# Patient Record
Sex: Female | Born: 1955 | Race: White | Hispanic: No | Marital: Married | Smoking: Never smoker
Health system: Southern US, Community
[De-identification: ages and names within clinical notes are randomized; demographics above are authoritative.]

## PROBLEM LIST (undated history)

## (undated) DIAGNOSIS — F419 Anxiety disorder, unspecified: Secondary | ICD-10-CM

## (undated) HISTORY — DX: Anxiety disorder, unspecified: F41.9

---

## 1993-12-10 HISTORY — PX: TUBAL LIGATION: SHX77

## 2004-11-01 ENCOUNTER — Ambulatory Visit: Payer: Self-pay

## 2005-02-07 HISTORY — PX: FOOT SURGERY: SHX648

## 2005-03-02 ENCOUNTER — Ambulatory Visit: Payer: Self-pay | Admitting: Podiatry

## 2005-05-24 ENCOUNTER — Ambulatory Visit: Payer: Self-pay

## 2006-12-10 HISTORY — PX: ABDOMINAL HYSTERECTOMY: SHX81

## 2007-10-23 ENCOUNTER — Ambulatory Visit: Payer: Self-pay

## 2007-11-26 ENCOUNTER — Ambulatory Visit: Payer: Self-pay | Admitting: Gastroenterology

## 2007-11-26 LAB — HM COLONOSCOPY: HM Colonoscopy: NORMAL

## 2008-01-29 ENCOUNTER — Ambulatory Visit: Payer: Self-pay | Admitting: Obstetrics and Gynecology

## 2008-02-06 ENCOUNTER — Inpatient Hospital Stay: Payer: Self-pay | Admitting: Obstetrics and Gynecology

## 2008-11-16 ENCOUNTER — Ambulatory Visit: Payer: Self-pay | Admitting: Obstetrics and Gynecology

## 2009-09-05 ENCOUNTER — Ambulatory Visit: Payer: Self-pay | Admitting: Otolaryngology

## 2009-11-30 ENCOUNTER — Ambulatory Visit: Payer: Self-pay | Admitting: Obstetrics and Gynecology

## 2009-12-05 ENCOUNTER — Ambulatory Visit: Payer: Self-pay | Admitting: Gastroenterology

## 2010-12-01 ENCOUNTER — Ambulatory Visit: Payer: Self-pay

## 2012-01-18 ENCOUNTER — Ambulatory Visit: Payer: Self-pay | Admitting: Urology

## 2012-05-01 LAB — HM PAP SMEAR: HM PAP: NEGATIVE

## 2012-07-30 ENCOUNTER — Ambulatory Visit: Payer: Self-pay | Admitting: Family Medicine

## 2013-11-24 ENCOUNTER — Ambulatory Visit: Payer: Self-pay | Admitting: Family Medicine

## 2014-05-13 LAB — BASIC METABOLIC PANEL
BUN: 19 mg/dL (ref 4–21)
Creatinine: 0.9 mg/dL (ref 0.5–1.1)
Glucose: 81 mg/dL
Potassium: 4.4 mmol/L (ref 3.4–5.3)
SODIUM: 141 mmol/L (ref 137–147)

## 2014-05-13 LAB — LIPID PANEL
CHOLESTEROL: 204 mg/dL — AB (ref 0–200)
HDL: 102 mg/dL — AB (ref 35–70)
LDL CALC: 94 mg/dL
Triglycerides: 41 mg/dL (ref 40–160)

## 2014-05-13 LAB — CBC AND DIFFERENTIAL
HCT: 40 % (ref 36–46)
Hemoglobin: 13.2 g/dL (ref 12.0–16.0)
Platelets: 214 10*3/uL (ref 150–399)
WBC: 4.5 10^3/mL

## 2014-05-13 LAB — HEPATIC FUNCTION PANEL
ALT: 20 U/L (ref 7–35)
AST: 22 U/L (ref 13–35)

## 2014-05-13 LAB — TSH: TSH: 2.64 u[IU]/mL (ref 0.41–5.90)

## 2014-12-14 ENCOUNTER — Ambulatory Visit: Payer: Self-pay | Admitting: Family Medicine

## 2014-12-14 LAB — HM MAMMOGRAPHY

## 2015-04-11 DIAGNOSIS — F419 Anxiety disorder, unspecified: Secondary | ICD-10-CM | POA: Insufficient documentation

## 2015-04-11 DIAGNOSIS — E7800 Pure hypercholesterolemia, unspecified: Secondary | ICD-10-CM | POA: Insufficient documentation

## 2015-04-11 DIAGNOSIS — Q676 Pectus excavatum: Secondary | ICD-10-CM | POA: Insufficient documentation

## 2015-04-11 DIAGNOSIS — E559 Vitamin D deficiency, unspecified: Secondary | ICD-10-CM | POA: Insufficient documentation

## 2015-04-11 DIAGNOSIS — F32A Depression, unspecified: Secondary | ICD-10-CM | POA: Insufficient documentation

## 2015-04-11 DIAGNOSIS — F329 Major depressive disorder, single episode, unspecified: Secondary | ICD-10-CM | POA: Insufficient documentation

## 2015-04-11 DIAGNOSIS — G2581 Restless legs syndrome: Secondary | ICD-10-CM | POA: Insufficient documentation

## 2015-04-11 DIAGNOSIS — L309 Dermatitis, unspecified: Secondary | ICD-10-CM | POA: Insufficient documentation

## 2015-06-03 ENCOUNTER — Ambulatory Visit (INDEPENDENT_AMBULATORY_CARE_PROVIDER_SITE_OTHER): Payer: BLUE CROSS/BLUE SHIELD | Admitting: Physician Assistant

## 2015-06-03 ENCOUNTER — Encounter: Payer: Self-pay | Admitting: Physician Assistant

## 2015-06-03 VITALS — BP 110/68 | HR 70 | Temp 97.2°F | Resp 16 | Ht 63.0 in | Wt 117.0 lb

## 2015-06-03 DIAGNOSIS — E7800 Pure hypercholesterolemia, unspecified: Secondary | ICD-10-CM

## 2015-06-03 DIAGNOSIS — Z0000 Encounter for general adult medical examination without abnormal findings: Secondary | ICD-10-CM | POA: Diagnosis not present

## 2015-06-03 DIAGNOSIS — N368 Other specified disorders of urethra: Secondary | ICD-10-CM | POA: Diagnosis not present

## 2015-06-03 DIAGNOSIS — N393 Stress incontinence (female) (male): Secondary | ICD-10-CM | POA: Diagnosis not present

## 2015-06-03 LAB — POCT URINALYSIS DIPSTICK
BILIRUBIN UA: NEGATIVE
Glucose, UA: NEGATIVE
Ketones, UA: NEGATIVE
Leukocytes, UA: NEGATIVE
NITRITE UA: NEGATIVE
PH UA: 6
Protein, UA: NEGATIVE
RBC UA: NEGATIVE
Spec Grav, UA: 1.015
Urobilinogen, UA: 0.2

## 2015-06-03 NOTE — Patient Instructions (Signed)
Health Maintenance Adopting a healthy lifestyle and getting preventive care can go a long way to promote health and wellness. Talk with your health care provider about what schedule of regular examinations is right for you. This is a good chance for you to check in with your provider about disease prevention and staying healthy. In between checkups, there are plenty of things you can do on your own. Experts have done a lot of research about which lifestyle changes and preventive measures are most likely to keep you healthy. Ask your health care provider for more information. WEIGHT AND DIET  Eat a healthy diet 1. Be sure to include plenty of vegetables, fruits, low-fat dairy products, and lean protein. 2. Do not eat a lot of foods high in solid fats, added sugars, or salt. 3. Get regular exercise. This is one of the most important things you can do for your health. 1. Most adults should exercise for at least 150 minutes each week. The exercise should increase your heart rate and make you sweat (moderate-intensity exercise). 2. Most adults should also do strengthening exercises at least twice a week. This is in addition to the moderate-intensity exercise.  Maintain a healthy weight 1. Body mass index (BMI) is a measurement that can be used to identify possible weight problems. It estimates body fat based on height and weight. Your health care provider can help determine your BMI and help you achieve or maintain a healthy weight. 2. For females 6 years of age and older:  1. A BMI below 18.5 is considered underweight. 2. A BMI of 18.5 to 24.9 is normal. 3. A BMI of 25 to 29.9 is considered overweight. 4. A BMI of 30 and above is considered obese.  Watch levels of cholesterol and blood lipids 1. You should start having your blood tested for lipids and cholesterol at 59 years of age, then have this test every 5 years. 2. You may need to have your cholesterol levels checked more often if: 1. Your  lipid or cholesterol levels are high. 2. You are older than 59 years of age. 3. You are at high risk for heart disease.  CANCER SCREENING   Lung Cancer 1. Lung cancer screening is recommended for adults 7-87 years old who are at high risk for lung cancer because of a history of smoking. 2. A yearly low-dose CT scan of the lungs is recommended for people who: 1. Currently smoke. 2. Have quit within the past 15 years. 3. Have at least a 30-pack-year history of smoking. A pack year is smoking an average of one pack of cigarettes a day for 1 year. 3. Yearly screening should continue until it has been 15 years since you quit. 4. Yearly screening should stop if you develop a health problem that would prevent you from having lung cancer treatment.  Breast Cancer  Practice breast self-awareness. This means understanding how your breasts normally appear and feel.  It also means doing regular breast self-exams. Let your health care provider know about any changes, no matter how small.  If you are in your 20s or 30s, you should have a clinical breast exam (CBE) by a health care provider every 1-3 years as part of a regular health exam.  If you are 30 or older, have a CBE every year. Also consider having a breast X-Madlock (mammogram) every year.  If you have a family history of breast cancer, talk to your health care provider about genetic screening.  If you are  at high risk for breast cancer, talk to your health care provider about having an MRI and a mammogram every year.  Breast cancer gene (BRCA) assessment is recommended for women who have family members with BRCA-related cancers. BRCA-related cancers include:  Breast.  Ovarian.  Tubal.  Peritoneal cancers.  Results of the assessment will determine the need for genetic counseling and BRCA1 and BRCA2 testing. Cervical Cancer Routine pelvic examinations to screen for cervical cancer are no longer recommended for nonpregnant women who  are considered low risk for cancer of the pelvic organs (ovaries, uterus, and vagina) and who do not have symptoms. A pelvic examination may be necessary if you have symptoms including those associated with pelvic infections. Ask your health care provider if a screening pelvic exam is right for you.   The Pap test is the screening test for cervical cancer for women who are considered at risk.  If you had a hysterectomy for a problem that was not cancer or a condition that could lead to cancer, then you no longer need Pap tests.  If you are older than 65 years, and you have had normal Pap tests for the past 10 years, you no longer need to have Pap tests.  If you have had past treatment for cervical cancer or a condition that could lead to cancer, you need Pap tests and screening for cancer for at least 20 years after your treatment.  If you no longer get a Pap test, assess your risk factors if they change (such as having a new sexual partner). This can affect whether you should start being screened again.  Some women have medical problems that increase their chance of getting cervical cancer. If this is the case for you, your health care provider may recommend more frequent screening and Pap tests.  The human papillomavirus (HPV) test is another test that may be used for cervical cancer screening. The HPV test looks for the virus that can cause cell changes in the cervix. The cells collected during the Pap test can be tested for HPV.  The HPV test can be used to screen women 2 years of age and older. Getting tested for HPV can extend the interval between normal Pap tests from three to five years.  An HPV test also should be used to screen women of any age who have unclear Pap test results.  After 59 years of age, women should have HPV testing as often as Pap tests.  Colorectal Cancer  This type of cancer can be detected and often prevented.  Routine colorectal cancer screening usually  begins at 59 years of age and continues through 59 years of age.  Your health care provider may recommend screening at an earlier age if you have risk factors for colon cancer.  Your health care provider may also recommend using home test kits to check for hidden blood in the stool.  A small camera at the end of a tube can be used to examine your colon directly (sigmoidoscopy or colonoscopy). This is done to check for the earliest forms of colorectal cancer.  Routine screening usually begins at age 57.  Direct examination of the colon should be repeated every 5-10 years through 59 years of age. However, you may need to be screened more often if early forms of precancerous polyps or small growths are found. Skin Cancer  Check your skin from head to toe regularly.  Tell your health care provider about any new moles or changes in  moles, especially if there is a change in a mole's shape or color.  Also tell your health care provider if you have a mole that is larger than the size of a pencil eraser.  Always use sunscreen. Apply sunscreen liberally and repeatedly throughout the day.  Protect yourself by wearing long sleeves, pants, a wide-brimmed hat, and sunglasses whenever you are outside. HEART DISEASE, DIABETES, AND HIGH BLOOD PRESSURE   Have your blood pressure checked at least every 1-2 years. High blood pressure causes heart disease and increases the risk of stroke.  If you are between 32 years and 30 years old, ask your health care provider if you should take aspirin to prevent strokes.  Have regular diabetes screenings. This involves taking a blood sample to check your fasting blood sugar level.  If you are at a normal weight and have a low risk for diabetes, have this test once every three years after 59 years of age.  If you are overweight and have a high risk for diabetes, consider being tested at a younger age or more often. PREVENTING INFECTION  Hepatitis B  If you have a  higher risk for hepatitis B, you should be screened for this virus. You are considered at high risk for hepatitis B if:  You were born in a country where hepatitis B is common. Ask your health care provider which countries are considered high risk.  Your parents were born in a high-risk country, and you have not been immunized against hepatitis B (hepatitis B vaccine).  You have HIV or AIDS.  You use needles to inject street drugs.  You live with someone who has hepatitis B.  You have had sex with someone who has hepatitis B.  You get hemodialysis treatment.  You take certain medicines for conditions, including cancer, organ transplantation, and autoimmune conditions. Hepatitis C  Blood testing is recommended for:  Everyone born from [birth year] through 1965.  Anyone with known risk factors for hepatitis C. Sexually transmitted infections (STIs)  You should be screened for sexually transmitted infections (STIs) including gonorrhea and chlamydia if:  You are sexually active and are younger than 59 years of age.  You are older than 59 years of age and your health care provider tells you that you are at risk for this type of infection.  Your sexual activity has changed since you were last screened and you are at an increased risk for chlamydia or gonorrhea. Ask your health care provider if you are at risk.  If you do not have HIV, but are at risk, it may be recommended that you take a prescription medicine daily to prevent HIV infection. This is called pre-exposure prophylaxis (PrEP). You are considered at risk if:  You are sexually active and do not regularly use condoms or know the HIV status of your partner(s).  You take drugs by injection.  You are sexually active with a partner who has HIV. Talk with your health care provider about whether you are at high risk of being infected with HIV. If you choose to begin PrEP, you should first be tested for HIV. You should then be tested  every 3 months for as long as you are taking PrEP.  PREGNANCY   If you are premenopausal and you may become pregnant, ask your health care provider about preconception counseling.  If you may become pregnant, take 400 to 800 micrograms (mcg) of folic acid every day.  If you want to prevent pregnancy, talk to your  health care provider about birth control (contraception). OSTEOPOROSIS AND MENOPAUSE   Osteoporosis is a disease in which the bones lose minerals and strength with aging. This can result in serious bone fractures. Your risk for osteoporosis can be identified using a bone density scan.  If you are 34 years of age or older, or if you are at risk for osteoporosis and fractures, ask your health care provider if you should be screened.  Ask your health care provider whether you should take a calcium or vitamin D supplement to lower your risk for osteoporosis.  Menopause may have certain physical symptoms and risks.  Hormone replacement therapy may reduce some of these symptoms and risks. Talk to your health care provider about whether hormone replacement therapy is right for you.  HOME CARE INSTRUCTIONS   Schedule regular health, dental, and eye exams.  Stay current with your immunizations.   Do not use any tobacco products including cigarettes, chewing tobacco, or electronic cigarettes.  If you are pregnant, do not drink alcohol.  If you are breastfeeding, limit how much and how often you drink alcohol.  Limit alcohol intake to no more than 1 drink per day for nonpregnant women. One drink equals 12 ounces of beer, 5 ounces of wine, or 1 ounces of hard liquor.  Do not use street drugs.  Do not share needles.  Ask your health care provider for help if you need support or information about quitting drugs.  Tell your health care provider if you often feel depressed.  Tell your health care provider if you have ever been abused or do not feel safe at home. Document  Released: 06/11/2011 Document Revised: 04/12/2014 Document Reviewed: 10/28/2013 [facility] Patient Information 2015 [place], [place]. This information is not intended to replace advice given to you by your health care provider. Make sure you discuss any questions you have with your health care provider.     Why follow it? Research shows. . Those who follow the Mediterranean diet have a reduced risk of heart disease  . The diet is associated with a reduced incidence of Parkinson's and Alzheimer's diseases . People following the diet may have longer life expectancies and lower rates of chronic diseases  . The Dietary Guidelines for Americans recommends the Mediterranean diet as an eating plan to promote health and prevent disease  What Is the Mediterranean Diet?  . Healthy eating plan based on typical foods and recipes of Mediterranean-style cooking . The diet is primarily a plant based diet; these foods should make up a majority of meals   Starches - Plant based foods should make up a majority of meals - They are an important sources of vitamins, minerals, energy, antioxidants, and fiber - Choose whole grains, foods high in fiber and minimally processed items  - Typical grain sources include wheat, oats, barley, corn, brown rice, bulgar, farro, millet, polenta, couscous  - Various types of beans include chickpeas, lentils, fava beans, black beans, white beans   Fruits  Veggies - Large quantities of antioxidant rich fruits & veggies; 6 or more servings  - Vegetables can be eaten raw or lightly drizzled with oil and cooked  - Vegetables common to the traditional Mediterranean Diet include: artichokes, arugula, beets, broccoli, brussel sprouts, cabbage, carrots, celery, collard greens, cucumbers, eggplant, kale, leeks, lemons, lettuce, mushrooms, okra, onions, peas, peppers, potatoes, pumpkin, radishes, rutabaga, shallots, spinach, sweet potatoes, turnips, zucchini - Fruits common to the Mediterranean  Diet include: apples, apricots, avocados, cherries, clementines, dates, figs, grapefruits,  grapes, melons, nectarines, oranges, peaches, pears, pomegranates, strawberries, tangerines  Fats - Replace butter and margarine with healthy oils, such as olive oil, canola oil, and tahini  - Limit nuts to no more than a handful a day  - Nuts include walnuts, almonds, pecans, pistachios, pine nuts  - Limit or avoid candied, honey roasted or heavily salted nuts - Olives are central to the Mediterranean diet - can be eaten whole or used in a variety of dishes   Meats Protein - Limiting red meat: no more than a few times a month - When eating red meat: choose lean cuts and keep the portion to the size of deck of cards - Eggs: approx. 0 to 4 times a week  - Fish and lean poultry: at least 2 a week  - Healthy protein sources include, chicken, [place], lean beef, lamb - Increase intake of seafood such as tuna, salmon, trout, mackerel, shrimp, scallops - Avoid or limit high fat processed meats such as sausage and bacon  Dairy - Include moderate amounts of low fat dairy products  - Focus on healthy dairy such as fat free yogurt, skim milk, low or reduced fat cheese - Limit dairy products higher in fat such as whole or 2% milk, cheese, ice cream  Alcohol - Moderate amounts of red wine is ok  - No more than 5 oz daily for women (all ages) and men older than age 74  - No more than 10 oz of wine daily for men younger than 44  Other - Limit sweets and other desserts  - Use herbs and spices instead of salt to flavor foods  - Herbs and spices common to the traditional Mediterranean Diet include: basil, bay leaves, chives, cloves, cumin, fennel, garlic, lavender, marjoram, mint, oregano, parsley, pepper, rosemary, sage, savory, sumac, tarragon, thyme   It's not just a diet, it's a lifestyle:  . The Mediterranean diet includes lifestyle factors typical of those in the region  . Foods, drinks and meals are best eaten  with others and savored . Daily physical activity is important for overall good health . This could be strenuous exercise like running and aerobics . This could also be more leisurely activities such as walking, housework, yard-work, or taking the stairs . Moderation is the key; a balanced and healthy diet accommodates most foods and drinks . Consider portion sizes and frequency of consumption of certain foods   Meal Ideas & Options:  . Breakfast:  o Whole wheat toast or whole wheat English muffins with peanut butter & hard boiled egg o Steel cut oats topped with apples & cinnamon and skim milk  o Fresh fruit: banana, strawberries, melon, berries, peaches  o Smoothies: strawberries, bananas, greek yogurt, peanut butter o Low fat greek yogurt with blueberries and granola  o Egg white omelet with spinach and mushrooms o Breakfast couscous: whole wheat couscous, apricots, skim milk, cranberries  . Sandwiches:  o Hummus and grilled vegetables (peppers, zucchini, squash) on whole wheat bread   o Grilled chicken on whole wheat pita with lettuce, tomatoes, cucumbers or tzatziki  o Tuna salad on whole wheat bread: tuna salad made with greek yogurt, olives, red peppers, capers, green onions o Garlic rosemary lamb pita: lamb sauted with garlic, rosemary, salt & pepper; add lettuce, cucumber, greek yogurt to pita - flavor with lemon juice and black pepper  . Seafood:  o Mediterranean grilled salmon, seasoned with garlic, basil, parsley, lemon juice and black pepper o Shrimp, lemon, and spinach  whole-grain pasta salad made with low fat greek yogurt  o Seared scallops with lemon orzo  o Seared tuna steaks seasoned salt, pepper, coriander topped with tomato mixture of olives, tomatoes, olive oil, minced garlic, parsley, green onions and cappers  . Meats:  o Herbed greek chicken salad with kalamata olives, cucumber, feta  o Red bell peppers stuffed with spinach, bulgur, lean ground beef (or lentils) &  topped with feta   o Kebabs: skewers of chicken, tomatoes, onions, zucchini, squash  o [place] burgers: made with red onions, mint, dill, lemon juice, feta cheese topped with roasted red peppers . Vegetarian o Cucumber salad: cucumbers, artichoke hearts, celery, red onion, feta cheese, tossed in olive oil & lemon juice  o Hummus and whole grain pita points with a greek salad (lettuce, tomato, feta, olives, cucumbers, red onion) o Lentil soup with celery, carrots made with vegetable broth, garlic, salt and pepper  o Tabouli salad: parsley, bulgur, mint, scallions, cucumbers, tomato, radishes, lemon juice, olive oil, salt and pepper.      American Heart Association (AHA) Exercise Recommendation  Being physically active is important to prevent heart disease and stroke, the nation's No. 1and No. 5killers. To improve overall cardiovascular health, we suggest at least 150 minutes per week of moderate exercise or 75 minutes per week of vigorous exercise (or a combination of moderate and vigorous activity). Thirty minutes a day, five times a week is an easy goal to remember. You will also experience benefits even if you divide your time into two or three segments of 10 to 15 minutes per day.  For people who would benefit from lowering their blood pressure or cholesterol, we recommend 40 minutes of aerobic exercise of moderate to vigorous intensity three to four times a week to lower the risk for heart attack and stroke.  Physical activity is anything that makes you move your body and burn calories.  This includes things like climbing stairs or playing sports. Aerobic exercises benefit your heart, and include walking, jogging, swimming or biking. Strength and stretching exercises are best for overall stamina and flexibility.  The simplest, positive change you can make to effectively improve your heart health is to start walking. It's enjoyable, free, easy, social and great exercise. A walking program is  flexible and boasts high success rates because people can stick with it. It's easy for walking to become a regular and satisfying part of life.   For Overall Cardiovascular Health:  At least 30 minutes of moderate-intensity aerobic activity at least 5 days per week for a total of 150  OR   At least 25 minutes of vigorous aerobic activity at least 3 days per week for a total of 75 minutes; or a combination of moderate- and vigorous-intensity aerobic activity  AND   Moderate- to high-intensity muscle-strengthening activity at least 2 days per week for additional health benefits.  For Lowering Blood Pressure and Cholesterol  An average 40 minutes of moderate- to vigorous-intensity aerobic activity 3 or 4 times per week  What if I can't make it to the time goal? Something is always better than nothing! And everyone has to start somewhere. Even if you've been sedentary for years, today is the day you can begin to make healthy changes in your life. If you don't think you'll make it for 30 or 40 minutes, set a reachable goal for today. You can work up toward your overall goal by increasing your time as you get stronger. Don't let all-or-nothing thinking  rob you of doing what you can every day.  Source:http://www.heart.org

## 2015-06-03 NOTE — Progress Notes (Signed)
Patient ID: Madison Reese, female   DOB: 03/24/1956, 59 y.o.   MRN: [id number]   Patient: Madison Reese, Female    DOB: 1956/10/19, 59 y.o.   MRN: [id number] Visit Date: 06/03/2015  Today's Provider: Margaretann Loveless, PA-C   Chief Complaint  Patient presents with  . Annual Exam   Subjective:    Annual physical exam [name] is a 59 y.o. female who presents today for health maintenance and complete physical. She feels well. She reports not exercising. She reports she is sleeping well, 6 hours per night.    -----------------------------------------------------------------   Review of Systems  Constitutional: Negative.   HENT: Negative.   Eyes: Negative.   Respiratory: Negative.   Cardiovascular: Negative.   Gastrointestinal: Negative.   Endocrine: Negative.   Genitourinary: Positive for urgency and dyspareunia (once). Negative for dysuria, frequency, hematuria, flank pain, vaginal bleeding, vaginal discharge, enuresis, vaginal pain, menstrual problem and pelvic pain.       Urine leakage  Musculoskeletal: Negative.   Skin: Negative.   Allergic/Immunologic: Negative.   Neurological: Negative.   Hematological: Negative.   Psychiatric/Behavioral: Negative.     Social History She  reports that she has never smoked. She does not have any smokeless tobacco history on file. She reports that she does not drink alcohol or use illicit drugs.  Patient Active Problem List   Diagnosis Date Noted  . Anxiety 04/11/2015  . 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 Hypercholesteremia 04/11/2015  . Pectus excavatum 04/11/2015  . Restless legs syndrome 04/11/2015  . Avitaminosis D 04/11/2015    Past Surgical History  Procedure Laterality Date  . Abdominal hysterectomy  2008    Partial- due to menorrhagia  . Foot surgery Bilateral 02/2005    removal of 5th digit on bilateral feet  . Tubal ligation  1995    Family History Her family  history includes Alzheimer's disease in her maternal grandmother; Arthritis in her mother; Cancer (age of onset: 78) in her mother; Depression in her mother and sister; Diabetes in her maternal uncle; Heart attack in her father; Hyperlipidemia in her sister; Hypertension in her brother and mother; Lung cancer in her maternal uncle.    Previous Medications   ALPRAZOLAM (XANAX) 0.5 MG TABLET    Take by mouth.   CALCIUM CARBONATE-VIT D-MIN PO    Take by mouth.   FLUTICASONE (FLONASE) 50 MCG/ACT NASAL SPRAY    Place into the nose.   LACTOBACILLUS (ULTIMATE PROBIOTIC FORMULA) CAPS    Take by mouth.   MULTIPLE VITAMIN TABLET    Take by mouth.   NUTRITIONAL SUPPLEMENTS (IMMUNE ENHANCE) TABS    Take by mouth.   OMEGA-3 FATTY ACIDS (FISH OIL) 1200 MG CAPS    Take by mouth.   PAROXETINE (PAXIL) 40 MG TABLET    Take by mouth.    Patient Care Team: Margaretann Loveless, PA-C as PCP - General (Physician Assistant)     Objective:   Vitals: BP 110/68 mmHg  Pulse 70  Temp(Src) 97.2 F (36.2 C) (Oral)  Resp 16  Ht  (1.6 m)  Wt 117 lb (53.071 kg)  BMI 20.73 kg/m2   Physical Exam  Constitutional: She is oriented to person, place, and time. She appears well-developed and well-nourished. No distress.  HENT:  Head: Normocephalic and atraumatic.  Right Ear: Hearing, tympanic membrane, external ear and ear canal normal.  Left Ear: Hearing, tympanic membrane, external ear and ear canal normal.  Nose:  Nose normal.  Mouth/Throat: Uvula is midline, oropharynx is clear and moist and mucous membranes are normal. No oropharyngeal exudate.  Eyes: Conjunctivae and EOM are normal. Pupils are equal, round, and reactive to light. Right eye exhibits no discharge. Left eye exhibits no discharge. No scleral icterus.  Neck: Normal range of motion. Neck supple. Carotid bruit is not present. No tracheal deviation present. No thyromegaly present.  Cardiovascular: Normal rate, regular rhythm, normal heart sounds and  intact distal pulses.  Exam reveals no gallop and no friction rub.   No murmur heard. Pulmonary/Chest: Effort normal and breath sounds normal. No respiratory distress. She has no wheezes. She has no rales. She exhibits no tenderness.  Abdominal: Soft. Bowel sounds are normal. She exhibits no distension and no mass. There is no tenderness. There is no rebound and no guarding.  Genitourinary: Rectum normal and vagina normal. Guaiac negative stool.    No breast swelling, tenderness, discharge or bleeding. There is no rash or tenderness on the right labia. There is no rash or tenderness on the left labia. Cervix exhibits no motion tenderness, no discharge and no friability. Right adnexum displays no mass and no tenderness. Left adnexum displays no mass and no tenderness. No vaginal discharge found.  S/p partial hysterectomy  Musculoskeletal: Normal range of motion. She exhibits no edema or tenderness.  Lymphadenopathy:    She has no cervical adenopathy.  Neurological: She is alert and oriented to person, place, and time. She has normal reflexes. No cranial nerve deficit. Coordination normal.  Skin: Skin is warm and dry. She is not diaphoretic. No erythema.  Psychiatric: She has a normal mood and affect. Her behavior is normal. Judgment and thought content normal.  Vitals reviewed.    Depression Screen PHQ 2/9 Scores 06/03/2015  PHQ - 2 Score 0      Assessment & Plan:     Routine Health Maintenance and Physical Exam  Exercise Activities and Dietary recommendations Goals    None      Immunization History  Administered Date(s) Administered  . Tdap 10/01/2007    Health Maintenance  Topic Date Due  . HIV Screening  08/13/1971  . PAP SMEAR  05/02/2015  . INFLUENZA VACCINE  07/11/2015  . MAMMOGRAM  12/14/2016  . TETANUS/TDAP  09/30/2017  . COLONOSCOPY  11/25/2017      Discussed health benefits of physical activity, and encouraged her to engage in regular exercise appropriate  for her age and condition.   1. Routine general medical examination at a health care facility - CBC with Differential - Comprehensive metabolic panel - TSH - Pap IG w/ reflex to HPV when [facility]U ([facility]) - HIV antibody - POCT Urinalysis Dipstick  2. Hypercholesteremia Stable.  Will check labs. - Lipid Profile  3. Prolapse urethral mucosa Noticed on PE today.  She does report one episode of dyspareunia and urinary incontinence recently.  She was concerned over the larger appearance of the urethra and requested referral for further workup and treatment options. - Ambulatory referral to Urology  4. Stress incontinence in female See above. - Ambulatory referral to Urology    --------------------------------------------------------------------

## 2015-06-04 LAB — COMPREHENSIVE METABOLIC PANEL
ALT: 19 IU/L (ref 0–32)
AST: 24 IU/L (ref 0–40)
Albumin/Globulin Ratio: 1.8 (ref 1.1–2.5)
Albumin: 4.4 g/dL (ref 3.5–5.5)
Alkaline Phosphatase: 90 IU/L (ref 39–117)
BILIRUBIN TOTAL: 0.4 mg/dL (ref 0.0–1.2)
BUN/Creatinine Ratio: 21 (ref 9–23)
BUN: 20 mg/dL (ref 6–24)
CO2: 26 mmol/L (ref 18–29)
Calcium: 9.9 mg/dL (ref 8.7–10.2)
Chloride: 102 mmol/L (ref 97–108)
Creatinine, Ser: 0.95 mg/dL (ref 0.57–1.00)
GFR calc Af Amer: 76 mL/min/{1.73_m2} (ref >59)
GFR, EST NON AFRICAN AMERICAN: 66 mL/min/{1.73_m2} (ref >59)
Globulin, Total: 2.5 g/dL (ref 1.5–4.5)
Glucose: 87 mg/dL (ref 65–99)
Potassium: 4.7 mmol/L (ref 3.5–5.2)
Sodium: 144 mmol/L (ref 134–144)
TOTAL PROTEIN: 6.9 g/dL (ref 6.0–8.5)

## 2015-06-04 LAB — CBC WITH DIFFERENTIAL/PLATELET
Basophils Absolute: 0 10*3/uL (ref 0.0–0.2)
Basos: 1 %
EOS (ABSOLUTE): 0.1 10*3/uL (ref 0.0–0.4)
EOS: 4 %
Hematocrit: 39 % (ref 34.0–46.6)
Hemoglobin: 12.9 g/dL (ref 11.1–15.9)
IMMATURE GRANULOCYTES: 0 %
Immature Grans (Abs): 0 10*3/uL (ref 0.0–0.1)
Lymphocytes Absolute: 1 10*3/uL (ref 0.7–3.1)
Lymphs: 29 %
MCH: 28.2 pg (ref 26.6–33.0)
MCHC: 33.1 g/dL (ref 31.5–35.7)
MCV: 85 fL (ref 79–97)
MONOCYTES: 7 %
Monocytes Absolute: 0.3 10*3/uL (ref 0.1–0.9)
NEUTROS ABS: 2.2 10*3/uL (ref 1.4–7.0)
NEUTROS PCT: 59 %
Platelets: 264 10*3/uL (ref 150–379)
RBC: 4.58 x10E6/uL (ref 3.77–5.28)
RDW: 14.2 % (ref 12.3–15.4)
WBC: 3.6 10*3/uL (ref 3.4–10.8)

## 2015-06-04 LAB — LIPID PANEL
Chol/HDL Ratio: 2 ratio units (ref 0.0–4.4)
Cholesterol, Total: 215 mg/dL — ABNORMAL HIGH (ref 100–199)
HDL: 109 mg/dL (ref >39)
LDL CALC: 98 mg/dL (ref 0–99)
Triglycerides: 40 mg/dL (ref 0–149)
VLDL Cholesterol Cal: 8 mg/dL (ref 5–40)

## 2015-06-04 LAB — TSH: TSH: 2.02 u[IU]/mL (ref 0.450–4.500)

## 2015-06-04 LAB — HIV ANTIBODY (ROUTINE TESTING W REFLEX): HIV SCREEN 4TH GENERATION: NONREACTIVE

## 2015-06-06 ENCOUNTER — Telehealth: Payer: Self-pay

## 2015-06-06 ENCOUNTER — Encounter: Payer: Self-pay | Admitting: Family Medicine

## 2015-06-06 DIAGNOSIS — F419 Anxiety disorder, unspecified: Secondary | ICD-10-CM

## 2015-06-06 DIAGNOSIS — F32A Depression, unspecified: Secondary | ICD-10-CM

## 2015-06-06 DIAGNOSIS — F329 Major depressive disorder, single episode, unspecified: Secondary | ICD-10-CM

## 2015-06-06 MED ORDER — ALPRAZOLAM 0.5 MG PO TABS: 0.5000 mg | ORAL_TABLET | Freq: Three times a day (TID) | ORAL | Status: DC | PRN

## 2015-06-06 MED ORDER — PAROXETINE HCL 40 MG PO TABS: 40.0000 mg | ORAL_TABLET | ORAL | Status: DC

## 2015-06-06 NOTE — Telephone Encounter (Signed)
-----   Message from Margaretann Loveless, [place] sent at 06/06/2015  9:43 AM EDT ----- All labs are stable and WNL except for total cholesterol is slightly elevated at 215 (normal high is 200).  Watch cholesterol intake.  Will recheck in 6 months.  Thanks! -JB

## 2015-06-06 NOTE — Telephone Encounter (Signed)
Please inform her both are ready for pick up.  Did not have a pharmacy on file.  Thanks! JB

## 2015-06-06 NOTE — Telephone Encounter (Signed)
Left patient a voicemail advising her that the RX is at the front desk for pick up.

## 2015-06-06 NOTE — Telephone Encounter (Signed)
Patient is requesting a refill on Xanax and Paxil. Patient is aware that the Xanax RX will have to be picked up.

## 2015-06-06 NOTE — Telephone Encounter (Signed)
Patient advised as directed below. Patient verbalized understanding.  

## 2015-06-07 LAB — PAP IG W/ RFLX HPV ASCU: PAP Smear Comment: 0

## 2015-06-08 ENCOUNTER — Telehealth: Payer: Self-pay | Admitting: Physician Assistant

## 2015-06-30 ENCOUNTER — Encounter: Payer: Self-pay | Admitting: Urology

## 2015-06-30 ENCOUNTER — Ambulatory Visit (INDEPENDENT_AMBULATORY_CARE_PROVIDER_SITE_OTHER): Payer: BLUE CROSS/BLUE SHIELD | Admitting: Urology

## 2015-06-30 VITALS — BP 138/73 | HR 61 | Ht 63.0 in | Wt 119.2 lb

## 2015-06-30 DIAGNOSIS — N368 Other specified disorders of urethra: Secondary | ICD-10-CM

## 2015-06-30 DIAGNOSIS — N3941 Urge incontinence: Secondary | ICD-10-CM

## 2015-06-30 DIAGNOSIS — N393 Stress incontinence (female) (male): Secondary | ICD-10-CM

## 2015-06-30 LAB — MICROSCOPIC EXAMINATION: BACTERIA UA: NONE SEEN

## 2015-06-30 LAB — URINALYSIS, COMPLETE
Bilirubin, UA: NEGATIVE
Glucose, UA: NEGATIVE
KETONES UA: NEGATIVE
Nitrite, UA: NEGATIVE
Protein, UA: NEGATIVE
RBC UA: NEGATIVE
Specific Gravity, UA: 1.02 (ref 1.005–1.030)
Urobilinogen, Ur: 0.2 mg/dL (ref 0.2–1.0)
pH, UA: 5 (ref 5.0–7.5)

## 2015-06-30 LAB — BLADDER SCAN AMB NON-IMAGING

## 2015-06-30 MED ORDER — SOLIFENACIN SUCCINATE 5 MG PO TABS: 5.0000 mg | ORAL_TABLET | Freq: Every day | ORAL | Status: DC

## 2015-06-30 NOTE — Progress Notes (Signed)
06/30/2015 8:57 AM   Madison Reese 1956/03/01 [id number]  Referring provider: Margaretann Loveless, PA-C [address] [place], [place] [address]  Chief Complaint  Patient presents with  . Urinary Incontinence    New Patient    HPI: 59 yo who presents today for evaluation of incontinence.  Her leakage began approximately 1 year following her partial hysterectomy.   She reports that she leaks with laughing, coughing, sneezing, working out at [employer] and playing with grandkids.   She does not wear pad but can at times saturate her pants.  This has gotten progressively worse over the past few years.    She also has significant urgency and occasional urge incontinence.  She does not get up at night to urinate.  She does feel that she is able to empty her bladder fully and does not double void    She drinks primarily water and avoids coffee/ tea.  No EtOH.    No history of UTI, kidney stones, or hematuria.    She is s/p partial hysterectomy ~2010 (laparoscopic).  G2P2 with history NSVD x 2.       She does complain of occasional yspareunia but no vaginal dryness.    She has never performed Kegel    PMH: Past Medical History  Diagnosis Date  . Anxiety     Surgical History: Past Surgical History  Procedure Laterality Date  . Abdominal hysterectomy  2008    Partial- due to menorrhagia  . Foot surgery Bilateral 02/2005    removal of 5th digit on bilateral feet  . Tubal ligation  1995    Home Medications:    Medication List       This list is accurate as of: 06/30/15 11:59 PM.  Always use your most recent med list.               ALPRAZolam 0.5 MG tablet  Commonly known as:  XANAX  Take 1 tablet (0.5 mg total) by mouth 3 (three) times daily as needed for anxiety.     CALCIUM CARBONATE-VIT D-MIN PO  Take by mouth.     Fish Oil 1200 MG Caps  Take by mouth.     fluticasone 50 MCG/ACT nasal spray  Commonly known as:  FLONASE  Place into the nose.     IMMUNE ENHANCE Tabs  Take by mouth.     Multiple Vitamin tablet  Take by mouth.     PARoxetine 40 MG tablet  Commonly known as:  PAXIL  Take 1 tablet (40 mg total) by mouth every morning.     solifenacin 5 MG tablet  Commonly known as:  VESICARE  Take 1 tablet (5 mg total) by mouth daily.     ULTIMATE PROBIOTIC FORMULA Caps  Take by mouth.        Allergies: No Known Allergies  Family History: Family History  Problem Relation Age of Onset  . Arthritis Mother   . Depression Mother   . Hypertension Mother   . Cancer Mother 53    Pancreatic cancer  . Heart attack Father   . Hyperlipidemia Sister   . Hypertension Brother   . Lung cancer Maternal Uncle   . Diabetes Maternal Uncle   . Alzheimer's disease Maternal Grandmother   . Depression Sister     Social History:  reports that she has never smoked. She does not have any smokeless tobacco history on file. She reports that she does not drink alcohol or use  illicit drugs.  ROS: UROLOGY Frequent Urination?: No Hard to postpone urination?: Yes Burning/pain with urination?: No Get up at night to urinate?: No Leakage of urine?: Yes Urine stream starts and stops?: No Trouble starting stream?: No Do you have to strain to urinate?: No Blood in urine?: No Urinary tract infection?: No Sexually transmitted disease?: No Injury to kidneys or bladder?: No Painful intercourse?: No Weak stream?: No Currently pregnant?: No Vaginal bleeding?: No Last menstrual period?: hysterectomy  Gastrointestinal Nausea?: No Vomiting?: No Indigestion/heartburn?: No Diarrhea?: No Constipation?: No  Constitutional Fever: No Night sweats?: No Weight loss?: No Fatigue?: No  Skin Skin rash/lesions?: No Itching?: No  Eyes Blurred vision?: No Double vision?: No  Ears/Nose/Throat Sore throat?: No Sinus problems?: No  Hematologic/Lymphatic Swollen glands?: No Easy bruising?: No  Cardiovascular Leg swelling?: No Chest  pain?: No  Respiratory Cough?: No Shortness of breath?: No  Endocrine Excessive thirst?: No  Musculoskeletal Back pain?: No Joint pain?: No  Neurological Headaches?: No Dizziness?: No  Psychologic Depression?: No Anxiety?: No  Physical Exam: BP 138/73 mmHg  Pulse 61  Ht  (1.6 m)  Wt 119 lb 3.2 oz (54.069 kg)  BMI 21.12 kg/m2  Constitutional:  Alert and oriented, No acute distress. HEENT: [place] AT, moist mucus membranes.  Trachea midline, no masses. Cardiovascular: No clubbing, cyanosis, or edema. Respiratory: Normal respiratory effort, no increased work of breathing. GI: Abdomen is soft, nontender, nondistended, no abdominal masses GU: No CVA tenderness.  Pelvic: normal external genitalia. External urethral meatus with significant hypermobility with Valsalva and demonstrable urinary leakage when examined in the supine position. There is very mild mucosal prolapse with Valsalva meatus somewhat capacious. Vaginal exam reveals normal vaginal mucosa, fairly decent anterior support, stage I cystocele and rectocele without significant apical descent. Skin: No rashes, bruises or suspicious lesions. Lymph: No cervical or inguinal adenopathy. Neurologic: Grossly intact, no focal deficits, moving all 4 extremities. Psychiatric: Normal mood and affect.  Laboratory Data: Lab Results  Component Value Date   WBC 3.6 06/03/2015   HGB 13.2 05/13/2014   HCT 39.0 06/03/2015   PLT 214 05/13/2014    Lab Results  Component Value Date   CREATININE 0.95 06/03/2015    Urinalysis    Component Value Date/Time   GLUCOSEU Negative 06/30/2015 0841   BILIRUBINUR Negative 06/30/2015 0841   BILIRUBINUR neg 06/03/2015 0951   PROTEINUR neg 06/03/2015 0951   UROBILINOGEN 0.2 06/03/2015 0951   NITRITE Negative 06/30/2015 0841   NITRITE neg 06/03/2015 0951   LEUKOCYTESUR 1+* 06/30/2015 0841   LEUKOCYTESUR Negative 06/03/2015 0951    Pertinent Imaging: n/a  Assessment & Plan:   59 year old female with mixed urinary incontinence, primarily bothered by stress incontinence which is demonstrable today on pelvic exam.  1. Stress incontinence in female Discussed options for treatment of stress urinary incontinence including physical therapy, urethral bulking agents, and mid urethral sling placement. We discussed the risks and benefits of each and the overall efficacy rate. Although she does not wear any pads per day, she does leak fairly significantly and is quite bothered by this. She does also have demonstrable stress incontinence with Valsalva when examined in the supine position with an empty bladder which is suggestive of fairly severe stress incontinence. At this point, she would like to proceed with physical therapy and potentially consider referral to a surgeon who specializes in female incontinence such as Dr. Sherron Monday in [place]. She'll let us know if she like [place] to follow through with that referral. - Urinalysis,  Complete - BLADDER SCAN AMB NON-IMAGING - Ambulatory referral to Physical Therapy - Microscopic Examination  2. Urge incontinence Discussed her urge symptoms today as well as episodes of urge incontinence. She would like an oral medication for this. We discussed the common side effects of anticholinergic medications including dry eyes, dry mouth, and constipation. - solifenacin (VESICARE) 5 MG tablet; Take 1 tablet (5 mg total) by mouth daily.  Dispense: 30 tablet; Refill: 11  3. Prolapse urethral mucosa Minimal urethral mucosal prolapse with Valsalva, not significant.   No Follow-up on file.  Vanna Scotland, MD  [facility] [address] [place], [place] [address] [phone]  I spent 45 min with this patient of which greater than 50% was spent in counseling and coordination of care with the patient.

## 2016-01-03 ENCOUNTER — Ambulatory Visit: Payer: BLUE CROSS/BLUE SHIELD | Admitting: Urology

## 2016-05-23 DIAGNOSIS — H524 Presbyopia: Secondary | ICD-10-CM | POA: Diagnosis not present

## 2016-07-04 ENCOUNTER — Other Ambulatory Visit: Payer: Self-pay | Admitting: Physician Assistant

## 2016-07-04 ENCOUNTER — Telehealth: Payer: Self-pay | Admitting: Physician Assistant

## 2016-07-04 DIAGNOSIS — F32A Depression, unspecified: Secondary | ICD-10-CM

## 2016-07-04 DIAGNOSIS — F329 Major depressive disorder, single episode, unspecified: Secondary | ICD-10-CM

## 2016-07-04 MED ORDER — PAROXETINE HCL 40 MG PO TABS: 40.0000 mg | ORAL_TABLET | ORAL | 5 refills | Status: DC

## 2016-07-04 NOTE — Telephone Encounter (Signed)
Refill sent of paroxetine to [facility]

## 2016-07-04 NOTE — Telephone Encounter (Signed)
Pt. Advised. 

## 2016-07-04 NOTE — Telephone Encounter (Signed)
Pt is requesting refill on her paroxetine 40 MG.sent into [facility] [address]

## 2016-07-13 NOTE — Telephone Encounter (Signed)
error 

## 2016-09-19 DIAGNOSIS — Z23 Encounter for immunization: Secondary | ICD-10-CM | POA: Diagnosis not present

## 2016-11-15 ENCOUNTER — Encounter: Payer: Self-pay | Admitting: Physician Assistant

## 2016-11-15 ENCOUNTER — Ambulatory Visit (INDEPENDENT_AMBULATORY_CARE_PROVIDER_SITE_OTHER): Payer: BLUE CROSS/BLUE SHIELD | Admitting: Physician Assistant

## 2016-11-15 VITALS — BP 118/70 | HR 72 | Temp 98.1°F | Resp 16 | Ht 64.0 in | Wt 121.4 lb

## 2016-11-15 DIAGNOSIS — Z1159 Encounter for screening for other viral diseases: Secondary | ICD-10-CM

## 2016-11-15 DIAGNOSIS — Z124 Encounter for screening for malignant neoplasm of cervix: Secondary | ICD-10-CM

## 2016-11-15 DIAGNOSIS — Z13820 Encounter for screening for osteoporosis: Secondary | ICD-10-CM

## 2016-11-15 DIAGNOSIS — F3342 Major depressive disorder, recurrent, in full remission: Secondary | ICD-10-CM | POA: Diagnosis not present

## 2016-11-15 DIAGNOSIS — Z1239 Encounter for other screening for malignant neoplasm of breast: Secondary | ICD-10-CM

## 2016-11-15 DIAGNOSIS — Z780 Asymptomatic menopausal state: Secondary | ICD-10-CM

## 2016-11-15 DIAGNOSIS — E559 Vitamin D deficiency, unspecified: Secondary | ICD-10-CM | POA: Diagnosis not present

## 2016-11-15 DIAGNOSIS — F419 Anxiety disorder, unspecified: Secondary | ICD-10-CM | POA: Diagnosis not present

## 2016-11-15 DIAGNOSIS — Z1272 Encounter for screening for malignant neoplasm of vagina: Secondary | ICD-10-CM | POA: Diagnosis not present

## 2016-11-15 DIAGNOSIS — E7800 Pure hypercholesterolemia, unspecified: Secondary | ICD-10-CM

## 2016-11-15 DIAGNOSIS — Z1231 Encounter for screening mammogram for malignant neoplasm of breast: Secondary | ICD-10-CM | POA: Diagnosis not present

## 2016-11-15 DIAGNOSIS — Z0000 Encounter for general adult medical examination without abnormal findings: Secondary | ICD-10-CM | POA: Diagnosis not present

## 2016-11-15 DIAGNOSIS — R87615 Unsatisfactory cytologic smear of cervix: Secondary | ICD-10-CM | POA: Diagnosis not present

## 2016-11-15 MED ORDER — ALPRAZOLAM 0.5 MG PO TABS: 0.5000 mg | ORAL_TABLET | Freq: Three times a day (TID) | ORAL | 1 refills | Status: DC | PRN

## 2016-11-15 MED ORDER — PAROXETINE HCL 40 MG PO TABS: 40.0000 mg | ORAL_TABLET | ORAL | 5 refills | Status: DC

## 2016-11-15 NOTE — Progress Notes (Signed)
Patient: Madison Reese, Female    DOB: 06-22-1956, 60 y.o.   MRN: [id number] Visit Date: 11/15/2016  Today's Provider: Margaretann Loveless, PA-C   Chief Complaint  Patient presents with  . Annual Exam   Subjective:    Annual physical exam Madison Reese is a 60 y.o. female who presents today for health maintenance and complete physical. She feels well. She reports exercising. She reports she is sleeping well.   Last CPE:06/03/2015 Pap-06/03/15 Unsatisfactory for evaluation-needs to be repeated in 1 year. Mammogram:12/14/2014-BI-RADS 1 Colonoscopy:11/26/2007-Dr.Wohl WNL BMD-N/A Tdap-UTD 2008 -----------------------------------------------------------------  Review of Systems  Constitutional: Negative.   HENT: Negative.   Eyes: Negative.   Respiratory: Negative.   Cardiovascular: Negative.   Gastrointestinal: Negative.   Endocrine: Negative.   Genitourinary: Negative.   Musculoskeletal: Negative.   Skin: Negative.   Allergic/Immunologic: Negative.   Neurological: Negative.   Hematological: Negative.   Psychiatric/Behavioral: Negative.     Social History      She  reports that she has never smoked. She has never used smokeless tobacco. She reports that she does not drink alcohol or use drugs.       Social History   Social History  . Marital status: Married    Spouse name: N/A  . Number of children: N/A  . Years of education: N/A   Social History Main Topics  . Smoking status: Never Smoker  . Smokeless tobacco: Never Used  . Alcohol use No  . Drug use: No  . Sexual activity: Not Asked   Other Topics Concern  . None   Social History Narrative  . None    Past Medical History:  Diagnosis Date  . Anxiety      Patient Active Problem List   Diagnosis Date Noted  . 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 Stress incontinence in female 06/03/2015  . Anxiety 04/11/2015  . 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 Dermatitis, eczematoid 04/11/2015    . Hypercholesteremia 04/11/2015  . Pectus excavatum 04/11/2015  . Restless legs syndrome 04/11/2015  . Avitaminosis D 04/11/2015    Past Surgical History:  Procedure Laterality Date  . ABDOMINAL HYSTERECTOMY  2008   Partial- due to menorrhagia  . FOOT SURGERY Bilateral 02/2005   removal of 5th digit on bilateral feet  . TUBAL LIGATION  1995    Family History        Family Status  Relation Status  . Mother Deceased  . Father Deceased at age 48  . Sister Alive  . Brother Alive  . Maternal Uncle Deceased  . Maternal Grandmother Deceased  . Sister Alive        Her family history includes Alzheimer's disease in her maternal grandmother; Arthritis in her mother; Cancer (age of onset: 7) in her mother; Depression in her mother and sister; Diabetes in her maternal uncle; Heart attack in her father; Hyperlipidemia in her sister; Hypertension in her brother and mother; Lung cancer in her maternal uncle.     No Known Allergies   Current Outpatient Prescriptions:  .  ALPRAZolam (XANAX) 0.5 MG tablet, Take 1 tablet (0.5 mg total) by mouth 3 (three) times daily as needed for anxiety. (Patient not taking: Reported on 06/30/2015), Disp: 40 tablet, Rfl: 1 .  CALCIUM CARBONATE-VIT D-MIN PO, Take by mouth., Disp: , Rfl:  .  fluticasone (FLONASE) 50 MCG/ACT nasal spray, Place into the nose., Disp: , Rfl:  .  Lactobacillus (ULTIMATE PROBIOTIC FORMULA) CAPS, Take by mouth., Disp: ,  Rfl:  .  Multiple Vitamin tablet, Take by mouth., Disp: , Rfl:  .  Nutritional Supplements (IMMUNE ENHANCE) TABS, Take by mouth., Disp: , Rfl:  .  Omega-3 Fatty Acids (FISH OIL) 1200 MG CAPS, Take by mouth., Disp: , Rfl:  .  PARoxetine (PAXIL) 40 MG tablet, Take 1 tablet (40 mg total) by mouth every morning., Disp: 30 tablet, Rfl: 5 .  solifenacin (VESICARE) 5 MG tablet, Take 1 tablet (5 mg total) by mouth daily., Disp: 30 tablet, Rfl: 11   Patient Care Team: Margaretann LovelessJennifer M Tyrihanna Wingert, PA-C as PCP - General (Physician  Assistant)      Objective:   Vitals: There were no vitals taken for this visit.   Physical Exam  Constitutional: She is oriented to person, place, and time. She appears well-developed and well-nourished. No distress.  HENT:  Head: Normocephalic and atraumatic.  Right Ear: Hearing, tympanic membrane, external ear and ear canal normal.  Left Ear: Hearing, tympanic membrane, external ear and ear canal normal.  Nose: Nose normal.  Mouth/Throat: Uvula is midline, oropharynx is clear and moist and mucous membranes are normal. No oropharyngeal exudate.  Eyes: Conjunctivae and EOM are normal. Pupils are equal, round, and reactive to light. Right eye exhibits no discharge. Left eye exhibits no discharge. No scleral icterus.  Neck: Normal range of motion. Neck supple. No JVD present. Carotid bruit is not present. No tracheal deviation present. No thyromegaly present.  Cardiovascular: Normal rate, regular rhythm, normal heart sounds and intact distal pulses.  Exam reveals no gallop and no friction rub.   No murmur heard. Pulmonary/Chest: Effort normal and breath sounds normal. No respiratory distress. She has no wheezes. She has no rales. She exhibits no tenderness. Right breast exhibits no inverted nipple, no mass, no nipple discharge, no skin change and no tenderness. Left breast exhibits no inverted nipple, no mass, no nipple discharge, no skin change and no tenderness. Breasts are symmetrical.  Abdominal: Soft. Bowel sounds are normal. She exhibits no distension and no mass. There is no tenderness. There is no rebound and no guarding. Hernia confirmed negative in the right inguinal area and confirmed negative in the left inguinal area.  Genitourinary: Rectum normal, vagina normal and uterus normal. No breast swelling, tenderness, discharge or bleeding. Pelvic exam was performed with patient supine. There is no rash, tenderness, lesion or injury on the right labia. There is no rash, tenderness, lesion  or injury on the left labia. Cervix exhibits no motion tenderness, no discharge and no friability. Right adnexum displays no mass, no tenderness and no fullness. Left adnexum displays no mass, no tenderness and no fullness. No erythema, tenderness or bleeding in the vagina. No signs of injury around the vagina. No vaginal discharge found.  Musculoskeletal: Normal range of motion. She exhibits no edema or tenderness.  Lymphadenopathy:    She has no cervical adenopathy.       Right: No inguinal adenopathy present.       Left: No inguinal adenopathy present.  Neurological: She is alert and oriented to person, place, and time. She has normal reflexes. No cranial nerve deficit. Coordination normal.  Skin: Skin is warm and dry. No rash noted. She is not diaphoretic.  Psychiatric: She has a normal mood and affect. Her behavior is normal. Judgment and thought content normal.  Vitals reviewed.    Depression Screen PHQ 2/9 Scores 06/03/2015  PHQ - 2 Score 0      Assessment & Plan:     Routine Health  Maintenance and Physical Exam  Exercise Activities and Dietary recommendations Goals    None      Immunization History  Administered Date(s) Administered  . Tdap 10/01/2007    Health Maintenance  Topic Date Due  . Hepatitis C Screening  06-17-1956  . INFLUENZA VACCINE  07/10/2016  . ZOSTAVAX  08/12/2016  . MAMMOGRAM  12/14/2016  . TETANUS/TDAP  09/30/2017  . COLONOSCOPY  11/25/2017  . PAP SMEAR  06/02/2018  . HIV Screening  Completed     Discussed health benefits of physical activity, and encouraged her to engage in regular exercise appropriate for her age and condition.    1. Annual physical exam Normal physical exam today. Will check labs as below and f/u pending lab results. If labs are stable and WNL she will not need to have these rechecked for one year at her next annual physical exam. She is to call the office in the meantime if she has any acute issue, questions or  concerns. - CBC with Differential/Platelet - Hemoglobin A1c - TSH  2. Cervical cancer screening Patient has had a partial hysterectomy with cervix left intact and ovaries. Pap collected today. Will send as below and f/u pending results. - Pap IG and HPV (high risk) DNA detection  3. Breast cancer screening Breast exam today was normal. There is no family history of breast cancer. She does perform regular self breast exams. Mammogram was ordered as below. [facility] given [facility] patient so she may schedule her mammogram at her convenience. - MM Digital Screening; Future  4. Screening for osteoporosis Will get a baseline bone density as below. I will follow-up pending results. - DG Bone Density; Future  5. Postmenopausal estrogen deficiency See above medical treatment plan. - DG Bone Density; Future  6. Hypercholesteremia Will check labs as below and f/u pending results. - Comprehensive metabolic panel - Lipid panel  7. Avitaminosis D  8. Anxiety Stable. Diagnosis pulled for medication refill. Continue current medical treatment plan. - ALPRAZolam (XANAX) 0.5 MG tablet; Take 1 tablet (0.5 mg total) by mouth 3 (three) times daily as needed for anxiety.  Dispense: 40 tablet; Refill: 1  9. Recurrent major depressive disorder, in full remission (HCC) Stable. Diagnosis pulled for medication refill. Continue current medical treatment plan. - PARoxetine (PAXIL) 40 MG tablet; Take 1 tablet (40 mg total) by mouth every morning.  Dispense: 30 tablet; Refill: 5  10. Need for hepatitis C screening test - Hepatitis C antibody  --------------------------------------------------------------------    Margaretann LovelessJennifer M Eryck Negron, PA-C  [facility]

## 2016-11-15 NOTE — Patient Instructions (Signed)

## 2016-11-16 LAB — CBC WITH DIFFERENTIAL/PLATELET
BASOS: 1 %
Basophils Absolute: 0.1 10*3/uL (ref 0.0–0.2)
EOS (ABSOLUTE): 0.1 10*3/uL (ref 0.0–0.4)
Eos: 2 %
Hematocrit: 40.2 % (ref 34.0–46.6)
Hemoglobin: 13.4 g/dL (ref 11.1–15.9)
IMMATURE GRANS (ABS): 0 10*3/uL (ref 0.0–0.1)
Immature Granulocytes: 0 %
LYMPHS: 26 %
Lymphocytes Absolute: 1.1 10*3/uL (ref 0.7–3.1)
MCH: 27.7 pg (ref 26.6–33.0)
MCHC: 33.3 g/dL (ref 31.5–35.7)
MCV: 83 fL (ref 79–97)
Monocytes Absolute: 0.3 10*3/uL (ref 0.1–0.9)
Monocytes: 6 %
NEUTROS ABS: 2.9 10*3/uL (ref 1.4–7.0)
Neutrophils: 65 %
PLATELETS: 238 10*3/uL (ref 150–379)
RBC: 4.84 x10E6/uL (ref 3.77–5.28)
RDW: 14.5 % (ref 12.3–15.4)
WBC: 4.5 10*3/uL (ref 3.4–10.8)

## 2016-11-16 LAB — COMPREHENSIVE METABOLIC PANEL
A/G RATIO: 1.8 (ref 1.2–2.2)
ALBUMIN: 4.4 g/dL (ref 3.6–4.8)
ALT: 14 IU/L (ref 0–32)
AST: 17 IU/L (ref 0–40)
Alkaline Phosphatase: 88 IU/L (ref 39–117)
BILIRUBIN TOTAL: 0.3 mg/dL (ref 0.0–1.2)
BUN / CREAT RATIO: 21 (ref 12–28)
BUN: 18 mg/dL (ref 8–27)
CHLORIDE: 102 mmol/L (ref 96–106)
CO2: 26 mmol/L (ref 18–29)
Calcium: 9.7 mg/dL (ref 8.7–10.3)
Creatinine, Ser: 0.87 mg/dL (ref 0.57–1.00)
GFR calc non Af Amer: 73 mL/min/{1.73_m2} (ref >59)
GFR, EST AFRICAN AMERICAN: 84 mL/min/{1.73_m2} (ref >59)
Globulin, Total: 2.4 g/dL (ref 1.5–4.5)
Glucose: 83 mg/dL (ref 65–99)
POTASSIUM: 4.4 mmol/L (ref 3.5–5.2)
SODIUM: 144 mmol/L (ref 134–144)
TOTAL PROTEIN: 6.8 g/dL (ref 6.0–8.5)

## 2016-11-16 LAB — LIPID PANEL
CHOL/HDL RATIO: 2.1 ratio (ref 0.0–4.4)
Cholesterol, Total: 197 mg/dL (ref 100–199)
HDL: 96 mg/dL (ref >39)
LDL CALC: 92 mg/dL (ref 0–99)
Triglycerides: 46 mg/dL (ref 0–149)
VLDL Cholesterol Cal: 9 mg/dL (ref 5–40)

## 2016-11-16 LAB — TSH: TSH: 2.29 u[IU]/mL (ref 0.450–4.500)

## 2016-11-16 LAB — HEPATITIS C ANTIBODY

## 2016-11-16 LAB — HEMOGLOBIN A1C
Est. average glucose Bld gHb Est-mCnc: 114 mg/dL
Hgb A1c MFr Bld: 5.6 % (ref 4.8–5.6)

## 2016-11-19 ENCOUNTER — Telehealth: Payer: Self-pay

## 2016-11-19 LAB — PAP IG AND HPV HIGH-RISK
HPV, HIGH-RISK: NEGATIVE
PAP SMEAR COMMENT: 0

## 2016-11-19 NOTE — Telephone Encounter (Signed)
lmtcb Dorrene Bently Drozdowski, CMA  

## 2016-11-19 NOTE — Telephone Encounter (Signed)
Advised pt of lab results. Pt verbally acknowledges understanding. Emily Drozdowski, CMA   

## 2016-11-19 NOTE — Telephone Encounter (Signed)
-----   Message from Margaretann LovelessJennifer M Burnette, [place]PA-C sent at 11/16/2016  8:42 AM EST ----- All labs are within normal limits and stable.  Thanks! -JB

## 2016-11-19 NOTE — Telephone Encounter (Signed)
lmtcb Emily Drozdowski, CMA  

## 2016-11-19 NOTE — Telephone Encounter (Signed)
Advised pt of lab results. Pt verbally acknowledges understanding. Appointment made. Allene DillonEmily Drozdowski, CMA

## 2016-11-19 NOTE — Telephone Encounter (Signed)
Pt is returning call.  [phone]/WJCB#[phone]/MW

## 2016-11-19 NOTE — Telephone Encounter (Signed)
-----   Message from Margaretann LovelessJennifer M Burnette, [place]-C sent at 11/19/2016 11:59 AM EST ----- Pap was unsatisfactory due to inflammatory exudate, but was HPV negative. Patient may have BV or yeast infection causing inflammatory exudate. Recommend return appt with Nuswab to test for these as cause and to treat if patient is willing.

## 2016-11-26 ENCOUNTER — Ambulatory Visit (INDEPENDENT_AMBULATORY_CARE_PROVIDER_SITE_OTHER): Payer: BLUE CROSS/BLUE SHIELD | Admitting: Physician Assistant

## 2016-11-26 ENCOUNTER — Encounter: Payer: Self-pay | Admitting: Physician Assistant

## 2016-11-26 VITALS — BP 130/70 | HR 66 | Temp 98.3°F | Resp 16 | Wt 123.4 lb

## 2016-11-26 DIAGNOSIS — Z124 Encounter for screening for malignant neoplasm of cervix: Secondary | ICD-10-CM

## 2016-11-26 DIAGNOSIS — N898 Other specified noninflammatory disorders of vagina: Secondary | ICD-10-CM

## 2016-11-26 NOTE — Patient Instructions (Signed)

## 2016-11-26 NOTE — Progress Notes (Signed)
       Patient: Madison SanteeDiane C Reese Female    DOB: 01/06/1956   60 y.o.   MRN: [id number] Visit Date: 11/26/2016  Today's Provider: Margaretann LovelessJennifer M Burnette, PA-C   Chief Complaint  Patient presents with  . Follow-up    abnormal pap   Subjective:    HPI Patient is here to follow-up on abnormal pap. Pap was insufficient due to inflammatory changes so she returns for NuSwab and repeat pap.    No Known Allergies   Current Outpatient Prescriptions:  .  ALPRAZolam (XANAX) 0.5 MG tablet, Take 1 tablet (0.5 mg total) by mouth 3 (three) times daily as needed for anxiety., Disp: 40 tablet, Rfl: 1 .  CALCIUM CARBONATE-VIT D-MIN PO, Take by mouth., Disp: , Rfl:  .  fluticasone (FLONASE) 50 MCG/ACT nasal spray, Place into the nose., Disp: , Rfl:  .  Lactobacillus (ULTIMATE PROBIOTIC FORMULA) CAPS, Take by mouth., Disp: , Rfl:  .  Multiple Vitamin tablet, Take by mouth., Disp: , Rfl:  .  Nutritional Supplements (IMMUNE ENHANCE) TABS, Take by mouth., Disp: , Rfl:  .  Omega-3 Fatty Acids (FISH OIL) 1200 MG CAPS, Take by mouth., Disp: , Rfl:  .  PARoxetine (PAXIL) 40 MG tablet, Take 1 tablet (40 mg total) by mouth every morning., Disp: 30 tablet, Rfl: 5  Review of Systems  Constitutional: Negative.   Respiratory: Negative.   Cardiovascular: Negative.   Gastrointestinal: Negative.   Genitourinary: Negative.     Social History  Substance Use Topics  . Smoking status: Never Smoker  . Smokeless tobacco: Never Used  . Alcohol use No   Objective:   BP 130/70 (BP Location: Right Arm, Patient Position: Sitting, Cuff Size: Normal)   Pulse 66   Temp 98.3 F (36.8 C) (Oral)   Resp 16   Wt 123 lb 6.4 oz (56 kg)   BMI 21.18 kg/m   Physical Exam  Constitutional: She appears well-developed and well-nourished. No distress.  Abdominal: Hernia confirmed negative in the right inguinal area and confirmed negative in the left inguinal area.  Genitourinary: Vagina normal. There is no rash, tenderness,  lesion or injury on the right labia. There is no rash, tenderness, lesion or injury on the left labia. Cervix exhibits no motion tenderness, no discharge and no friability. Right adnexum displays no mass, no tenderness and no fullness. Left adnexum displays no mass, no tenderness and no fullness. No erythema or tenderness in the vagina. No signs of injury around the vagina. No vaginal discharge found.  Genitourinary Comments: Cervix intact, uterus surgically absent  Lymphadenopathy:       Right: No inguinal adenopathy present.       Left: No inguinal adenopathy present.  Vitals reviewed.       Assessment & Plan:     1. Vaginal discharge Will check NuSwab as below and f/u pending results.  - NuSwab Vaginitis Plus (VG+)  2. Cervical cancer screening Recheck pap since last was insufficient. - Pap IG and HPV (high risk) DNA detection       Margaretann LovelessJennifer M Burnette, PA-C  [facility]

## 2016-11-28 ENCOUNTER — Telehealth: Payer: Self-pay

## 2016-11-28 LAB — PAP IG AND HPV HIGH-RISK
HPV, HIGH-RISK: NEGATIVE
PAP Smear Comment: 0

## 2016-11-28 NOTE — Telephone Encounter (Signed)
Advised pt of lab results. Pt verbally acknowledges understanding. Arleigh Dicola Drozdowski, CMA   

## 2016-11-28 NOTE — Telephone Encounter (Signed)
-----   Message from Margaretann LovelessJennifer M Burnette, [place]PA-C sent at 11/28/2016  8:15 AM EST ----- Pap is normal and HPV negative. NuSwab is still pending

## 2016-11-29 ENCOUNTER — Telehealth: Payer: Self-pay

## 2016-11-29 LAB — NUSWAB VAGINITIS PLUS (VG+)
CANDIDA GLABRATA, NAA: NEGATIVE
CHLAMYDIA TRACHOMATIS, NAA: NEGATIVE
Candida albicans, NAA: NEGATIVE
Neisseria gonorrhoeae, NAA: NEGATIVE
TRICH VAG BY NAA: NEGATIVE

## 2016-11-29 NOTE — Telephone Encounter (Signed)
Patient advised as below.  

## 2016-11-29 NOTE — Telephone Encounter (Signed)
-----   Message from Margaretann LovelessJennifer M Burnette, [place]PA-C sent at 11/29/2016  8:20 AM EST ----- NuSwab was completely normal. Most likely BV that you cleared on your own as source of inflammation that was noted on first pap.

## 2017-01-03 ENCOUNTER — Other Ambulatory Visit: Payer: Self-pay

## 2017-01-03 ENCOUNTER — Ambulatory Visit: Payer: Self-pay

## 2017-02-01 ENCOUNTER — Ambulatory Visit (INDEPENDENT_AMBULATORY_CARE_PROVIDER_SITE_OTHER): Payer: BLUE CROSS/BLUE SHIELD | Admitting: Family Medicine

## 2017-02-01 ENCOUNTER — Encounter: Payer: Self-pay | Admitting: Family Medicine

## 2017-02-01 VITALS — BP 124/60 | HR 65 | Temp 97.6°F | Resp 16 | Wt 123.8 lb

## 2017-02-01 DIAGNOSIS — N3091 Cystitis, unspecified with hematuria: Secondary | ICD-10-CM

## 2017-02-01 DIAGNOSIS — R140 Abdominal distension (gaseous): Secondary | ICD-10-CM | POA: Diagnosis not present

## 2017-02-01 LAB — POCT URINALYSIS DIPSTICK
Bilirubin, UA: NEGATIVE
Glucose, UA: NEGATIVE
Nitrite, UA: NEGATIVE
PROTEIN UA: 100
SPEC GRAV UA: 1.025
UROBILINOGEN UA: 0.2
pH, UA: 5

## 2017-02-01 MED ORDER — NITROFURANTOIN MONOHYD MACRO 100 MG PO CAPS: 100.0000 mg | ORAL_CAPSULE | Freq: Two times a day (BID) | ORAL | 0 refills | Status: DC

## 2017-02-01 NOTE — Patient Instructions (Signed)
We will call you with the urine culture. 

## 2017-02-01 NOTE — Progress Notes (Signed)
Subjective:     Patient ID: Madison Reese, female   DOB: 04/13/1956, 61 y.o.   MRN: [id number]  HPI  Chief Complaint  Patient presents with  . Urinary Tract Infection    Patient comes in office today with concerns of a urinary tract infection for the past week. Patient reports symptoms of urgency, frequency, dysuria and seeing traces of blood when wiping. Patient reports that she has been taking otc Azo for relief.   Also states she feels persistent abdominal bloating and gas. 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   Review of Systems     Objective:   Physical Exam  Constitutional: She appears well-developed and well-nourished. No distress.  Genitourinary:  Genitourinary Comments: No cva tenderness       Assessment:    1. Cystitis with hematuria - Urine culture - POCT urinalysis dipstick - nitrofurantoin, macrocrystal-monohydrate, (MACROBID) 100 MG capsule; Take 1 capsule (100 mg total) by mouth 2 (two) times daily.  Dispense: 14 capsule; Refill: 0  2. Abdominal bloating    Plan:    Provided with FODMAP food list. Discussed taking a Xanax prior to work.Further f/u pending urine culture.

## 2017-02-04 ENCOUNTER — Telehealth: Payer: Self-pay

## 2017-02-04 LAB — URINE CULTURE

## 2017-02-04 NOTE — Telephone Encounter (Signed)
-----   Message from Anola Gurneyobert Chauvin, [place]PA sent at 02/04/2017  1:42 PM EST ----- You have an E.Coli infection sensitive to the nitrofurantoin antibiotic you are on.

## 2017-02-13 ENCOUNTER — Ambulatory Visit
Admission: RE | Admit: 2017-02-13 | Discharge: 2017-02-13 | Disposition: A | Discharge status: Home / Self Care | Payer: BLUE CROSS/BLUE SHIELD | Source: Ambulatory Visit | Attending: Physician Assistant | Admitting: Physician Assistant

## 2017-02-13 DIAGNOSIS — Z13820 Encounter for screening for osteoporosis: Secondary | ICD-10-CM | POA: Diagnosis not present

## 2017-02-13 DIAGNOSIS — M85851 Other specified disorders of bone density and structure, right thigh: Secondary | ICD-10-CM | POA: Diagnosis not present

## 2017-02-13 DIAGNOSIS — Z1231 Encounter for screening mammogram for malignant neoplasm of breast: Secondary | ICD-10-CM | POA: Insufficient documentation

## 2017-02-13 DIAGNOSIS — M8588 Other specified disorders of bone density and structure, other site: Secondary | ICD-10-CM | POA: Diagnosis not present

## 2017-02-13 DIAGNOSIS — Z780 Asymptomatic menopausal state: Secondary | ICD-10-CM | POA: Diagnosis not present

## 2017-02-13 DIAGNOSIS — Z1239 Encounter for other screening for malignant neoplasm of breast: Secondary | ICD-10-CM

## 2017-02-14 ENCOUNTER — Telehealth: Payer: Self-pay

## 2017-02-14 NOTE — Telephone Encounter (Signed)
-----   Message from Margaretann LovelessJennifer M Burnette, [place]PA-C sent at 02/14/2017  9:03 AM EST ----- Normal mammogram. Repeat screening in one year.

## 2017-02-14 NOTE — Telephone Encounter (Signed)
LMTCB 02/14/2017  Thanks,   -Jenae Tomasello  

## 2017-02-15 NOTE — Telephone Encounter (Signed)
Pt advised-aa 

## 2017-07-10 ENCOUNTER — Encounter: Payer: Self-pay | Admitting: Physician Assistant

## 2017-07-10 ENCOUNTER — Ambulatory Visit (INDEPENDENT_AMBULATORY_CARE_PROVIDER_SITE_OTHER): Payer: BLUE CROSS/BLUE SHIELD | Admitting: Physician Assistant

## 2017-07-10 VITALS — BP 140/70 | HR 75 | Temp 99.9°F | Resp 16 | Wt 119.4 lb

## 2017-07-10 DIAGNOSIS — K047 Periapical abscess without sinus: Secondary | ICD-10-CM | POA: Diagnosis not present

## 2017-07-10 MED ORDER — AMOXICILLIN 875 MG PO TABS: 875.0000 mg | ORAL_TABLET | Freq: Two times a day (BID) | ORAL | 0 refills | Status: DC

## 2017-07-10 NOTE — Patient Instructions (Signed)
Amoxicillin capsules or tablets What is this medicine? AMOXICILLIN (a mox i SIL in) is a penicillin antibiotic. It is used to treat certain kinds of bacterial infections. It will not work for colds, flu, or other viral infections. This medicine may be used for other purposes; ask your health care provider or pharmacist if you have questions. COMMON BRAND NAME(S): Amoxil, Moxilin, Sumox, Trimox What should I tell my health care provider before I take this medicine? They need to know if you have any of these conditions: -asthma -kidney disease -an unusual or allergic reaction to amoxicillin, other penicillins, cephalosporin antibiotics, other medicines, foods, dyes, or preservatives -pregnant or trying to get pregnant -breast-feeding How should I use this medicine? Take this medicine by mouth with a glass of water. Follow the directions on your prescription label. You may take this medicine with food or on an empty stomach. Take your medicine at regular intervals. Do not take your medicine more often than directed. Take all of your medicine as directed even if you think your are better. Do not skip doses or stop your medicine early. Talk to your pediatrician regarding the use of this medicine in children. While this drug may be prescribed for selected conditions, precautions do apply. Overdosage: If you think you have taken too much of this medicine contact a poison control center or emergency room at once. NOTE: This medicine is only for you. Do not share this medicine with others. What if I miss a dose? If you miss a dose, take it as soon as you can. If it is almost time for your next dose, take only that dose. Do not take double or extra doses. What may interact with this medicine? -amiloride -birth control pills -chloramphenicol -macrolides -probenecid -sulfonamides -tetracyclines This list may not describe all possible interactions. Give your health care provider a list of all the  medicines, herbs, non-prescription drugs, or dietary supplements you use. Also tell them if you smoke, drink alcohol, or use illegal drugs. Some items may interact with your medicine. What should I watch for while using this medicine? Tell your doctor or health care professional if your symptoms do not improve in 2 or 3 days. Take all of the doses of your medicine as directed. Do not skip doses or stop your medicine early. If you are diabetic, you may get a false positive result for sugar in your urine with certain brands of urine tests. Check with your doctor. Do not treat diarrhea with over-the-counter products. Contact your doctor if you have diarrhea that lasts more than 2 days or if the diarrhea is severe and watery. What side effects may I notice from receiving this medicine? Side effects that you should report to your doctor or health care professional as soon as possible: -allergic reactions like skin rash, itching or hives, swelling of the face, lips, or tongue -breathing problems -dark urine -redness, blistering, peeling or loosening of the skin, including inside the mouth -seizures -severe or watery diarrhea -trouble passing urine or change in the amount of urine -unusual bleeding or bruising -unusually weak or tired -yellowing of the eyes or skin Side effects that usually do not require medical attention (report to your doctor or health care professional if they continue or are bothersome): -dizziness -headache -stomach upset -trouble sleeping This list may not describe all possible side effects. Call your doctor for medical advice about side effects. You may report side effects to FDA at 1-800-FDA-1088. Where should I keep my medicine? Keep out   of the reach of children. Store between 68 and 77 degrees F (20 and 25 degrees C). Keep bottle closed tightly. Throw away any unused medicine after the expiration date. NOTE: This sheet is a summary. It may not cover all possible  information. If you have questions about this medicine, talk to your doctor, pharmacist, or health care provider.  2018 Elsevier/Gold Standard (2008-02-17 14:10:59)

## 2017-07-10 NOTE — Progress Notes (Signed)
Patient: Madison Reese Female    DOB: 09/17/1956   61 y.o.   MRN: [id number] Visit Date: 07/10/2017  Today's Provider: Margaretann LovelessJennifer M Wendel Homeyer, PA-C   Chief Complaint  Patient presents with  . Generalized Body Aches   Subjective:    HPI Patient is here today with c/o body aches all over. This started Sunday evening with heaviness on her legs. She is feeling tired,weak, had chills yesterday a night and took some Ibuprofen at 9 pm.associated symptoms light headache on right side and pain on right side of gum.Denies: Chest pain,leg swelling,URI symptoms,palpitations,abdominal pain,visual disturbance,dizziness or light-headedness.     No Known Allergies   Current Outpatient Prescriptions:  .  ALPRAZolam (XANAX) 0.5 MG tablet, Take 1 tablet (0.5 mg total) by mouth 3 (three) times daily as needed for anxiety., Disp: 40 tablet, Rfl: 1 .  CALCIUM CARBONATE-VIT D-MIN PO, Take by mouth., Disp: , Rfl:  .  Lactobacillus (ULTIMATE PROBIOTIC FORMULA) CAPS, Take by mouth., Disp: , Rfl:  .  Multiple Vitamin tablet, Take by mouth., Disp: , Rfl:  .  Nutritional Supplements (IMMUNE ENHANCE) TABS, Take by mouth., Disp: , Rfl:  .  Omega-3 Fatty Acids (FISH OIL) 1200 MG CAPS, Take by mouth., Disp: , Rfl:  .  PARoxetine (PAXIL) 40 MG tablet, Take 1 tablet (40 mg total) by mouth every morning., Disp: 30 tablet, Rfl: 5 .  nitrofurantoin, macrocrystal-monohydrate, (MACROBID) 100 MG capsule, Take 1 capsule (100 mg total) by mouth 2 (two) times daily. (Patient not taking: Reported on 07/10/2017), Disp: 14 capsule, Rfl: 0  Review of Systems  Constitutional: Positive for chills (a little last night) and fatigue. Negative for fever.  HENT: Negative for congestion, postnasal drip, rhinorrhea, sinus pain, sinus pressure, sneezing, sore throat and trouble swallowing.   Eyes: Negative for visual disturbance.  Respiratory: Negative for cough, chest tightness, shortness of breath and wheezing.   Cardiovascular:  Negative for chest pain, palpitations and leg swelling.  Gastrointestinal: Negative for abdominal pain, diarrhea, nausea and vomiting.  Musculoskeletal: Positive for arthralgias (aching all over-feels like legs and arms are heavy.). Negative for back pain.  Neurological: Positive for weakness and headaches (slight headache). Negative for dizziness and light-headedness.    Social History  Substance Use Topics  . Smoking status: Never Smoker  . Smokeless tobacco: Never Used  . Alcohol use No   Objective:   BP 140/70 (BP Location: Right Arm, Patient Position: Sitting, Cuff Size: Normal)   Pulse 75   Temp 99.9 F (37.7 C) (Oral)   Resp 16   Wt 119 lb 6.4 oz (54.2 kg)   SpO2 98%   BMI 20.49 kg/m     Physical Exam  Constitutional: She appears well-developed and well-nourished. No distress.  HENT:  Head: Normocephalic and atraumatic.  Right Ear: Hearing, tympanic membrane, external ear and ear canal normal.  Left Ear: Hearing, tympanic membrane, external ear and ear canal normal.  Nose: Nose normal.  Mouth/Throat: Uvula is midline, oropharynx is clear and moist and mucous membranes are normal. Abnormal dentition. No oropharyngeal exudate.    Eyes: Pupils are equal, round, and reactive to light. Conjunctivae are normal. Right eye exhibits no discharge. Left eye exhibits no discharge. No scleral icterus.  Neck: Normal range of motion. Neck supple. No tracheal deviation present. No thyromegaly present.  Cardiovascular: Normal rate, regular rhythm and normal heart sounds.  Exam reveals no gallop and no friction rub.   No murmur heard. Pulmonary/Chest: Effort normal and  breath sounds normal. No stridor. No respiratory distress. She has no wheezes. She has no rales.  Lymphadenopathy:    She has no cervical adenopathy.  Skin: Skin is warm and dry. She is not diaphoretic.  Vitals reviewed.       Assessment & Plan:     1. Tooth abscess Early onset. Will give amoxicillin as below.  Patient is to call if no improvements as she is concerned for mono (has not been exposed and does not have sore throat). She has an appt with her dentist sometime later this month.  - amoxicillin (AMOXIL) 875 MG tablet; Take 1 tablet (875 mg total) by mouth 2 (two) times daily.  Dispense: 20 tablet; Refill: 0       Margaretann LovelessJennifer M Sihaam Chrobak, PA-C  [facility]

## 2017-07-15 ENCOUNTER — Ambulatory Visit (INDEPENDENT_AMBULATORY_CARE_PROVIDER_SITE_OTHER): Payer: BLUE CROSS/BLUE SHIELD | Admitting: Physician Assistant

## 2017-07-15 ENCOUNTER — Encounter: Payer: Self-pay | Admitting: Physician Assistant

## 2017-07-15 VITALS — BP 130/70 | HR 84 | Temp 100.1°F | Resp 16 | Wt 118.8 lb

## 2017-07-15 DIAGNOSIS — R51 Headache: Secondary | ICD-10-CM

## 2017-07-15 DIAGNOSIS — R519 Headache, unspecified: Secondary | ICD-10-CM

## 2017-07-15 DIAGNOSIS — R5383 Other fatigue: Secondary | ICD-10-CM | POA: Diagnosis not present

## 2017-07-15 DIAGNOSIS — R52 Pain, unspecified: Secondary | ICD-10-CM | POA: Diagnosis not present

## 2017-07-15 LAB — POCT URINALYSIS DIPSTICK
Bilirubin, UA: NEGATIVE
Glucose, UA: NEGATIVE
Ketones, UA: NEGATIVE
LEUKOCYTES UA: NEGATIVE
NITRITE UA: NEGATIVE
Protein, UA: NEGATIVE
RBC UA: NEGATIVE
Spec Grav, UA: 1.01 (ref 1.010–1.025)
UROBILINOGEN UA: 0.2 U/dL
pH, UA: 7.5 (ref 5.0–8.0)

## 2017-07-15 NOTE — Progress Notes (Signed)
Patient: Madison Reese Female    DOB: 02/15/1956   61 y.o.   MRN: [id number] Visit Date: 07/15/2017  Today's Provider: Margaretann LovelessJennifer M Aicia Babinski, PA-C   Chief Complaint  Patient presents with  . Fatigue   Subjective:    HPI Patient here today C/O fatigue, body ache, and headache since last office visit. Patient reports good tolerance and compliance with antibiotic prescribed on 07/10/17. Patient is requesting urine and labs to be checked. Patient denies fever, or any UTI symptoms. Patient reports that in the past she has not had symptoms and did have a UTI. She does report that since starting the antibiotic she has noticed her body aches improving and she has not had the shooting pain in the right jaw, but still continues to have fatigue and headache. No fever, nausea, or vomiting. Does have decreased appetite.    No Known Allergies   Current Outpatient Prescriptions:  .  ALPRAZolam (XANAX) 0.5 MG tablet, Take 1 tablet (0.5 mg total) by mouth 3 (three) times daily as needed for anxiety., Disp: 40 tablet, Rfl: 1 .  amoxicillin (AMOXIL) 875 MG tablet, Take 1 tablet (875 mg total) by mouth 2 (two) times daily., Disp: 20 tablet, Rfl: 0 .  CALCIUM CARBONATE-VIT D-MIN PO, Take by mouth., Disp: , Rfl:  .  Lactobacillus (ULTIMATE PROBIOTIC FORMULA) CAPS, Take by mouth., Disp: , Rfl:  .  Multiple Vitamin tablet, Take by mouth., Disp: , Rfl:  .  Nutritional Supplements (IMMUNE ENHANCE) TABS, Take by mouth., Disp: , Rfl:  .  Omega-3 Fatty Acids (FISH OIL) 1200 MG CAPS, Take by mouth., Disp: , Rfl:  .  PARoxetine (PAXIL) 40 MG tablet, Take 1 tablet (40 mg total) by mouth every morning., Disp: 30 tablet, Rfl: 5  Review of Systems  Constitutional: Positive for appetite change and fatigue. Negative for chills and fever.  HENT: Positive for dental problem. Negative for congestion, ear discharge, ear pain, postnasal drip, rhinorrhea, sinus pain, sinus pressure, sore throat, tinnitus and trouble  swallowing.   Respiratory: Positive for cough (dry). Negative for chest tightness and shortness of breath.   Cardiovascular: Negative for chest pain, palpitations and leg swelling.  Gastrointestinal: Negative for abdominal pain, constipation, diarrhea, nausea and vomiting.  Genitourinary: Negative for dysuria, flank pain, frequency, genital sores, hematuria, pelvic pain, vaginal bleeding, vaginal discharge and vaginal pain.  Musculoskeletal: Negative for back pain.  Neurological: Positive for headaches. Negative for dizziness and light-headedness.    Social History  Substance Use Topics  . Smoking status: Never Smoker  . Smokeless tobacco: Never Used  . Alcohol use No   Objective:   BP 130/70 (BP Location: Left Arm, Patient Position: Sitting, Cuff Size: Normal)   Pulse 84   Temp 100.1 F (37.8 C) (Oral)   Resp 16   Wt 118 lb 12.8 oz (53.9 kg)   SpO2 98%   BMI 20.39 kg/m  Vitals:   07/15/17 0907  BP: 130/70  Pulse: 84  Resp: 16  Temp: 100.1 F (37.8 C)  TempSrc: Oral  SpO2: 98%  Weight: 118 lb 12.8 oz (53.9 kg)     Physical Exam  Constitutional: She appears well-developed and well-nourished. No distress.  HENT:  Head: Normocephalic and atraumatic.  Right Ear: Hearing, tympanic membrane, external ear and ear canal normal.  Left Ear: Hearing, tympanic membrane, external ear and ear canal normal.  Nose: Nose normal.  Mouth/Throat: Uvula is midline, oropharynx is clear and moist and mucous membranes are  normal. Abnormal dentition (no redness of gum any longer). No oropharyngeal exudate, posterior oropharyngeal edema or posterior oropharyngeal erythema.  Eyes: Pupils are equal, round, and reactive to light. Conjunctivae are normal. Right eye exhibits no discharge. Left eye exhibits no discharge. No scleral icterus.  Neck: Normal range of motion. Neck supple. No tracheal deviation present. No thyromegaly present.  Cardiovascular: Normal rate, regular rhythm and normal heart  sounds.  Exam reveals no gallop and no friction rub.   No murmur heard. Pulmonary/Chest: Effort normal and breath sounds normal. No stridor. No respiratory distress. She has no wheezes. She has no rales.  Lymphadenopathy:    She has no cervical adenopathy.  Skin: Skin is warm and dry. She is not diaphoretic.  Vitals reviewed.       Assessment & Plan:     1. Fatigue, unspecified type Unsure of cause of continued fatigue and headache. Advised to push fluids to stay hydrated. Will check labs as below. DDx: iron def, anemia, B12, def, vit d def, underactive thyroid, possible mono exposure. Will check labs as below and f/u pending results. She is to call if symptoms worsen in the meantime.  - CBC w/Diff/Platelet - Comprehensive Metabolic Panel (CMET) - TSH - Vitamin D (25 hydroxy) - B12 - POCT Urinalysis Dipstick - Iron Binding Cap (TIBC) - Iron - Epstein-Barr virus VCA antibody panel  2. Nonintractable headache, unspecified chronicity pattern, unspecified headache type See above medical treatment plan. - CBC w/Diff/Platelet - Comprehensive Metabolic Panel (CMET) - TSH - Vitamin D (25 hydroxy) - B12 - POCT Urinalysis Dipstick - Iron Binding Cap (TIBC) - Iron - Epstein-Barr virus VCA antibody panel  3. Body aches See above medical treatment plan. - CBC w/Diff/Platelet - Comprehensive Metabolic Panel (CMET) - TSH - Vitamin D (25 hydroxy) - B12 - POCT Urinalysis Dipstick - Iron Binding Cap (TIBC) - Iron - Epstein-Barr virus VCA antibody panel       Margaretann Loveless, PA-C  [facility]

## 2017-07-15 NOTE — Patient Instructions (Signed)

## 2017-07-16 ENCOUNTER — Telehealth: Payer: Self-pay

## 2017-07-16 NOTE — Telephone Encounter (Signed)
-----   Message from Margaretann LovelessJennifer M Burnette, PA-C sent at 07/16/2017  3:23 PM EDT ----- Iron is significantly low. No anemia currently. Would recommend for patient to start ferrous sulfate 325mg  BID OTC. May cause GI upset so take with food. Will darken stools. If she has side effects she can use ferrous gluconate instead. This is a slow release so may not cause as many side effects. All other labs are normal. EBV (mono) titer is pending still. I would recommend rechecking CBC and iron levels in 6-8 weeks to make sure improving with supplementation.

## 2017-07-16 NOTE — Telephone Encounter (Signed)
Patient advised as below. Patient verbalizes understanding and is in agreement with treatment plan.  

## 2017-07-17 ENCOUNTER — Telehealth: Payer: Self-pay

## 2017-07-17 LAB — IRON AND TIBC
IRON SATURATION: 6 % — AB (ref 15–55)
Iron: 18 ug/dL — ABNORMAL LOW (ref 27–159)
TIBC: 317 ug/dL (ref 250–450)
UIBC: 299 ug/dL (ref 131–425)

## 2017-07-17 LAB — CBC WITH DIFFERENTIAL/PLATELET
BASOS: 1 %
Basophils Absolute: 0 10*3/uL (ref 0.0–0.2)
EOS (ABSOLUTE): 0.1 10*3/uL (ref 0.0–0.4)
EOS: 1 %
HEMATOCRIT: 37.4 % (ref 34.0–46.6)
HEMOGLOBIN: 12.4 g/dL (ref 11.1–15.9)
IMMATURE GRANS (ABS): 0 10*3/uL (ref 0.0–0.1)
Immature Granulocytes: 0 %
LYMPHS ABS: 0.8 10*3/uL (ref 0.7–3.1)
LYMPHS: 13 %
MCH: 27.5 pg (ref 26.6–33.0)
MCHC: 33.2 g/dL (ref 31.5–35.7)
MCV: 83 fL (ref 79–97)
MONOCYTES: 9 %
Monocytes Absolute: 0.5 10*3/uL (ref 0.1–0.9)
NEUTROS ABS: 4.7 10*3/uL (ref 1.4–7.0)
Neutrophils: 76 %
Platelets: 254 10*3/uL (ref 150–379)
RBC: 4.51 x10E6/uL (ref 3.77–5.28)
RDW: 13.8 % (ref 12.3–15.4)
WBC: 6.2 10*3/uL (ref 3.4–10.8)

## 2017-07-17 LAB — TSH: TSH: 2.72 u[IU]/mL (ref 0.450–4.500)

## 2017-07-17 LAB — COMPREHENSIVE METABOLIC PANEL
ALBUMIN: 4.1 g/dL (ref 3.6–4.8)
ALK PHOS: 109 IU/L (ref 39–117)
ALT: 19 IU/L (ref 0–32)
AST: 22 IU/L (ref 0–40)
Albumin/Globulin Ratio: 1.6 (ref 1.2–2.2)
BUN / CREAT RATIO: 14 (ref 12–28)
BUN: 12 mg/dL (ref 8–27)
CHLORIDE: 102 mmol/L (ref 96–106)
CO2: 25 mmol/L (ref 20–29)
CREATININE: 0.86 mg/dL (ref 0.57–1.00)
Calcium: 9.6 mg/dL (ref 8.7–10.3)
GFR calc non Af Amer: 74 mL/min/{1.73_m2} (ref >59)
GFR, EST AFRICAN AMERICAN: 85 mL/min/{1.73_m2} (ref >59)
GLOBULIN, TOTAL: 2.5 g/dL (ref 1.5–4.5)
GLUCOSE: 99 mg/dL (ref 65–99)
Potassium: 4.4 mmol/L (ref 3.5–5.2)
SODIUM: 143 mmol/L (ref 134–144)
TOTAL PROTEIN: 6.6 g/dL (ref 6.0–8.5)

## 2017-07-17 LAB — EPSTEIN-BARR VIRUS VCA ANTIBODY PANEL
EBV EARLY ANTIGEN AB, IGG: 14 U/mL — AB (ref 0.0–8.9)
EBV NA IgG: 418 U/mL — ABNORMAL HIGH (ref 0.0–17.9)
EBV VCA IgG: 279 U/mL — ABNORMAL HIGH (ref 0.0–17.9)

## 2017-07-17 LAB — VITAMIN B12: Vitamin B-12: 1046 pg/mL (ref 232–1245)

## 2017-07-17 LAB — VITAMIN D 25 HYDROXY (VIT D DEFICIENCY, FRACTURES): Vit D, 25-Hydroxy: 47.4 ng/mL (ref 30.0–100.0)

## 2017-07-17 NOTE — Telephone Encounter (Signed)
LMTCB

## 2017-07-17 NOTE — Telephone Encounter (Signed)
Patient advised as below.  Patient wants to know what could have been causing her fever. Patient denies any fever at this time.   Per Boneta LucksJenny pt fever could have been caused by the gum infection.   Patient advised and reports that she does have an appointment with her dentist today. sd

## 2017-07-17 NOTE — Telephone Encounter (Signed)
-----   Message from Margaretann LovelessJennifer M Burnette, [place]-C sent at 07/17/2017  8:23 AM EDT ----- EBV titer shows were she has had mono in the past but is not currently active or reinfected. Suspect fatigue from iron def.

## 2017-09-18 DIAGNOSIS — Z23 Encounter for immunization: Secondary | ICD-10-CM | POA: Diagnosis not present

## 2017-09-30 ENCOUNTER — Ambulatory Visit (INDEPENDENT_AMBULATORY_CARE_PROVIDER_SITE_OTHER): Payer: BLUE CROSS/BLUE SHIELD | Admitting: Physician Assistant

## 2017-09-30 ENCOUNTER — Encounter: Payer: Self-pay | Admitting: Physician Assistant

## 2017-09-30 VITALS — BP 122/80 | HR 68 | Temp 98.1°F | Resp 16 | Wt 121.0 lb

## 2017-09-30 DIAGNOSIS — J069 Acute upper respiratory infection, unspecified: Secondary | ICD-10-CM | POA: Diagnosis not present

## 2017-09-30 NOTE — Patient Instructions (Signed)
Upper Respiratory Infection, Adult Most upper respiratory infections (URIs) are caused by a virus. A URI affects the nose, throat, and upper air passages. The most common type of URI is often called "the common cold." Follow these instructions at home:  Take medicines only as told by your doctor.  Gargle warm saltwater or take cough drops to comfort your throat as told by your doctor.  Use a warm mist humidifier or inhale steam from a shower to increase air moisture. This may make it easier to breathe.  Drink enough fluid to keep your pee (urine) clear or pale yellow.  Eat soups and other clear broths.  Have a healthy diet.  Rest as needed.  Go back to work when your fever is gone or your doctor says it is okay. ? You may need to stay home longer to avoid giving your URI to others. ? You can also wear a face mask and wash your hands often to prevent spread of the virus.  Use your inhaler more if you have asthma.  Do not use any tobacco products, including cigarettes, chewing tobacco, or electronic cigarettes. If you need help quitting, ask your doctor. Contact a doctor if:  You are getting worse, not better.  Your symptoms are not helped by medicine.  You have chills.  You are getting more short of breath.  You have brown or red mucus.  You have yellow or brown discharge from your nose.  You have pain in your face, especially when you bend forward.  You have a fever.  You have puffy (swollen) neck glands.  You have pain while swallowing.  You have white areas in the back of your throat. Get help right away if:  You have very bad or constant: ? Headache. ? Ear pain. ? Pain in your forehead, behind your eyes, and over your cheekbones (sinus pain). ? Chest pain.  You have long-lasting (chronic) lung disease and any of the following: ? Wheezing. ? Long-lasting cough. ? Coughing up blood. ? A change in your usual mucus.  You have a stiff neck.  You have  changes in your: ? Vision. ? Hearing. ? Thinking. ? Mood. This information is not intended to replace advice given to you by your health care provider. Make sure you discuss any questions you have with your health care provider. Document Released: 05/14/2008 Document Revised: 07/29/2016 Document Reviewed: 03/03/2014 Elsevier Interactive Patient Education  2018 Elsevier Inc.  

## 2017-09-30 NOTE — Progress Notes (Signed)
[name]  Chief Complaint  Patient presents with  . Sinusitis    Started about six days ago.   Marland Kitchen. URI    Subjective:    Patient ID: Madison Reese, female    DOB: 10/07/1956, 61 y.o.   MRN: [id number]  Upper Respiratory Infection: Madison SanteeDiane C Eckard is a  61 y.o. female with a past medical history significant for sick contacts complaining of symptoms of a URI, possible sinusitis. Symptoms include congestion, cough, plugged sensation in both ears and sore throat. Onset of symptoms was 6 days ago, gradually worsening since that time. She also c/o nasal congestion, non productive cough, post nasal drip and sinus pressure for the past 6 days .  She is drinking plenty of fluids. Evaluation to date: none. Treatment to date: cough suppressants. The treatment has provided minimal relief. Husband sick with similar symptoms.   Review of Systems  Constitutional: Positive for fatigue. Negative for activity change, appetite change, chills, diaphoresis, fever and unexpected weight change.  HENT: Positive for congestion, nosebleeds, postnasal drip, rhinorrhea, sinus pain, sinus pressure, sore throat and voice change. Negative for ear discharge, ear pain, sneezing, tinnitus and trouble swallowing.   Eyes: Negative.   Respiratory: Positive for cough. Negative for apnea, choking, chest tightness, shortness of breath, wheezing and stridor.   Gastrointestinal: Negative.   Musculoskeletal: Negative for neck pain and neck stiffness.  Neurological: Negative for dizziness, light-headedness and headaches.       Objective:   BP 122/80 (BP Location: Left Arm, Patient Position: Sitting, Cuff Size: Normal)   Pulse 68   Temp 98.1 F (36.7 C) (Oral)   Resp 16   Wt 121 lb (54.9 kg)   BMI 20.77 kg/m   Patient Active Problem List   Diagnosis Date Noted  . 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 Stress incontinence in female 06/03/2015  . Anxiety 04/11/2015  .  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 Hypercholesteremia 04/11/2015  . Pectus excavatum 04/11/2015  . Restless legs syndrome 04/11/2015  . Avitaminosis D 04/11/2015    Outpatient Encounter Prescriptions as of 09/30/2017  Medication Sig Note  . ALPRAZolam (XANAX) 0.5 MG tablet Take 1 tablet (0.5 mg total) by mouth 3 (three) times daily as needed for anxiety.   Marland Kitchen. CALCIUM CARBONATE-VIT D-MIN PO Take by mouth. 04/11/2015: [facility]: [facility]  . Lactobacillus (ULTIMATE PROBIOTIC FORMULA) CAPS Take by mouth. 04/11/2015: Received from: [facility]  . Multiple Vitamin tablet Take by mouth. 04/11/2015: Received from: [facility]  . Nutritional Supplements (IMMUNE ENHANCE) TABS Take by mouth. 04/11/2015: Received from: [facility]  . Omega-3 Fatty Acids (FISH OIL) 1200 MG CAPS Take by mouth. 04/11/2015: Received from: [facility]  . PARoxetine (PAXIL) 40 MG tablet Take 1 tablet (40 mg total) by mouth every morning.   . [DISCONTINUED] amoxicillin (AMOXIL) 875 MG tablet Take 1 tablet (875 mg total) by mouth 2 (two) times daily.    No facility-administered encounter medications on file as of 09/30/2017.     No Known Allergies     Physical Exam  Constitutional: She is oriented to person, place, and time. She appears well-developed and well-nourished.  HENT:  Right Ear: External ear normal.  Left Ear: External ear normal.  Mouth/Throat: Oropharynx is clear and moist. No oropharyngeal exudate.  Eyes: Right eye exhibits discharge. Left eye exhibits discharge.  Neck: Neck supple.  Cardiovascular: Normal rate and regular rhythm.   Pulmonary/Chest:  Effort normal and breath sounds normal. No respiratory distress. She has no rales.  Lymphadenopathy:    She has no cervical adenopathy.  Neurological: She is alert and oriented to person, place, and time.  Skin: Skin is warm and dry.   Psychiatric: She has a normal mood and affect. Her behavior is normal.       Assessment & Plan:  1. Viral upper respiratory illness  Reviewed 7-10 day course of illness. Reviewed symptomatic treatment. Can call back Friday 10/04/2017 for abx, will give Augmentin.  Patient Instructions  Upper Respiratory Infection, Adult Most upper respiratory infections (URIs) are caused by a virus. A URI affects the nose, throat, and upper air passages. The most common type of URI is often called "the common cold." Follow these instructions at home:  Take medicines only as told by your doctor.  Gargle warm saltwater or take cough drops to comfort your throat as told by your doctor.  Use a warm mist humidifier or inhale steam from a shower to increase air moisture. This may make it easier to breathe.  Drink enough fluid to keep your pee (urine) clear or pale yellow.  Eat soups and other clear broths.  Have a healthy diet.  Rest as needed.  Go back to work when your fever is gone or your doctor says it is okay. ? You may need to stay home longer to avoid giving your URI to others. ? You can also wear a face mask and wash your hands often to prevent spread of the virus.  Use your inhaler more if you have asthma.  Do not use any tobacco products, including cigarettes, chewing tobacco, or electronic cigarettes. If you need help quitting, ask your doctor. Contact a doctor if:  You are getting worse, not better.  Your symptoms are not helped by medicine.  You have chills.  You are getting more short of breath.  You have brown or red mucus.  You have yellow or brown discharge from your nose.  You have pain in your face, especially when you bend forward.  You have a fever.  You have puffy (swollen) neck glands.  You have pain while swallowing.  You have white areas in the back of your throat. Get help right away if:  You have very bad or constant: ? Headache. ? Ear  pain. ? Pain in your forehead, behind your eyes, and over your cheekbones (sinus pain). ? Chest pain.  You have long-lasting (chronic) lung disease and any of the following: ? Wheezing. ? Long-lasting cough. ? Coughing up blood. ? A change in your usual mucus.  You have a stiff neck.  You have changes in your: ? Vision. ? Hearing. ? Thinking. ? Mood. This information is not intended to replace advice given to you by your health care provider. Make sure you discuss any questions you have with your health care provider. Document Released: 05/14/2008 Document Revised: 07/29/2016 Document Reviewed: 03/03/2014 Elsevier Interactive Patient Education  [employer].     The entirety of the information documented in the History of Present Illness, Review of Systems and Physical Exam were personally obtained by me. Portions of this information were initially documented by Kavin Leech, CMA and reviewed by me for thoroughness and accuracy.

## 2017-10-04 ENCOUNTER — Telehealth: Payer: Self-pay | Admitting: Physician Assistant

## 2017-10-04 DIAGNOSIS — J0100 Acute maxillary sinusitis, unspecified: Secondary | ICD-10-CM

## 2017-10-04 MED ORDER — AMOXICILLIN-POT CLAVULANATE 875-125 MG PO TABS
1.0000 | ORAL_TABLET | Freq: Two times a day (BID) | ORAL | 0 refills | Status: AC
Start: 2017-10-04 — End: 2017-10-14

## 2017-10-04 NOTE — Telephone Encounter (Signed)
Sent in Augmentin to [facility] for sinusitis.

## 2017-10-04 NOTE — Telephone Encounter (Signed)
Pt states she was seen 09/30/17 for sinus congestion.  Pt states she is not much better.  Pt states she is still having sinus congestion.  Pt is asking if she can get a Rx to help with this.  [facility] [address]  RU#[phone]/JYCB#[phone]/MW

## 2017-10-04 NOTE — Telephone Encounter (Signed)
LMTCB 10/04/2017   Thanks,   -Destany Severns  

## 2017-11-22 ENCOUNTER — Encounter: Payer: Self-pay | Admitting: Physician Assistant

## 2017-11-22 ENCOUNTER — Ambulatory Visit (INDEPENDENT_AMBULATORY_CARE_PROVIDER_SITE_OTHER): Payer: BLUE CROSS/BLUE SHIELD | Admitting: Physician Assistant

## 2017-11-22 VITALS — BP 142/74 | HR 72 | Temp 98.2°F | Resp 16 | Wt 121.0 lb

## 2017-11-22 DIAGNOSIS — Z8619 Personal history of other infectious and parasitic diseases: Secondary | ICD-10-CM | POA: Diagnosis not present

## 2017-11-22 DIAGNOSIS — J0100 Acute maxillary sinusitis, unspecified: Secondary | ICD-10-CM

## 2017-11-22 MED ORDER — DOXYCYCLINE HYCLATE 100 MG PO TABS: 100.0000 mg | ORAL_TABLET | Freq: Two times a day (BID) | ORAL | 0 refills | Status: DC

## 2017-11-22 MED ORDER — VALACYCLOVIR HCL 500 MG PO TABS: 500.0000 mg | ORAL_TABLET | Freq: Every day | ORAL | 5 refills | Status: DC

## 2017-11-22 NOTE — Progress Notes (Signed)
[name]  Chief Complaint  Patient presents with  . URI    Started about 8-9 days ago.  Worsening since Saturday  . Sinusitis    Subjective:    Patient ID: Madison Reese, female    DOB: 12/11/1955, 61 y.o.   MRN: [id number]  Upper Respiratory Infection: Madison SanteeDiane C Reese is a 61 y.o. female complaining of symptoms of a URI, possible sinusitis. Symptoms include congestion. Onset of symptoms was 9 days ago, gradually worsening since that time. She also c/o cough described as productive, nasal congestion, post nasal drip and sinus pressure for the past 6 days .  She is drinking plenty of fluids. Evaluation to date: none. Treatment to date: antihistamines and decongestants. The treatment has provided minimal relief.  Also reporting recent flare of cold sores. She would like something preventive to lessen these  Review of Systems  Constitutional: Positive for fatigue. Negative for activity change, appetite change, chills, diaphoresis, fever and unexpected weight change.  HENT: Positive for congestion, postnasal drip, rhinorrhea, sinus pressure and sinus pain. Negative for ear discharge, ear pain, nosebleeds, sore throat, tinnitus and trouble swallowing.   Eyes: Negative for photophobia, pain, discharge, redness, itching and visual disturbance.  Respiratory: Positive for cough and chest tightness. Negative for apnea, choking, shortness of breath, wheezing and stridor.   Gastrointestinal: Negative.   Neurological: Positive for light-headedness. Negative for dizziness and headaches.       Objective:   BP (!) 142/74 (BP Location: Right Arm, Patient Position: Sitting, Cuff Size: Normal)   Pulse 72   Temp 98.2 F (36.8 C) (Oral)   Resp 16   Wt 121 lb (54.9 kg)   BMI 20.77 kg/m   Patient Active Problem List   Diagnosis Date Noted  . 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 Stress incontinence in female 06/03/2015  . Anxiety 04/11/2015  .  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 Hypercholesteremia 04/11/2015  . Pectus excavatum 04/11/2015  . Restless legs syndrome 04/11/2015  . Avitaminosis D 04/11/2015    Outpatient Encounter Medications as of 11/22/2017  Medication Sig Note  . ALPRAZolam (XANAX) 0.5 MG tablet Take 1 tablet (0.5 mg total) by mouth 3 (three) times daily as needed for anxiety.   Marland Kitchen. CALCIUM CARBONATE-VIT D-MIN PO Take by mouth. 04/11/2015: Received from: [facility]  . Lactobacillus (ULTIMATE PROBIOTIC FORMULA) CAPS Take by mouth. 04/11/2015: Received from: [facility]  . Multiple Vitamin tablet Take by mouth. 04/11/2015: Received from: [facility]  . Nutritional Supplements (IMMUNE ENHANCE) TABS Take by mouth. 04/11/2015: Received from: [facility]  . Omega-3 Fatty Acids (FISH OIL) 1200 MG CAPS Take by mouth. 04/11/2015: Received from: [facility]  . PARoxetine (PAXIL) 40 MG tablet Take 1 tablet (40 mg total) by mouth every morning.    No facility-administered encounter medications on file as of 11/22/2017.     No Known Allergies     Physical Exam  Constitutional: She is oriented to person, place, and time. She appears well-developed and well-nourished.  HENT:  Head: Normocephalic and atraumatic.  Right Ear: External ear normal.  Left Ear: External ear normal.  Nose: Nose normal.  Mouth/Throat: Oropharynx is clear and moist.  Cardiovascular: Normal rate, regular rhythm, normal heart sounds and intact distal pulses.  Pulmonary/Chest: Effort normal. She has wheezes (Scattered bilateral).  Lymphadenopathy:    She has no cervical adenopathy.  Neurological: She is alert and oriented to person, place, and  time. She has normal reflexes.  Skin:  Vesicular lesions clustered around upper lip.   Psychiatric: She has a normal mood and affect. Her behavior is normal. Judgment and thought content  normal.       Assessment & Plan:  1. Acute maxillary sinusitis, recurrence not specified  - doxycycline (VIBRA-TABS) 100 MG tablet; Take 1 tablet (100 mg total) by mouth 2 (two) times daily.  Dispense: 20 tablet; Refill: 0  2. H/O cold sores  Recent flare has been present 3+ days, don't think acute treatment will be of much benefit, but may start prophylaxis.   - valACYclovir (VALTREX) 500 MG tablet; Take 1 tablet (500 mg total) by mouth daily.  Dispense: 30 tablet; Refill: 5   The entirety of the information documented in the History of Present Illness, Review of Systems and Physical Exam were personally obtained by me. Portions of this information were initially documented by Kavin LeechLaura Walsh, CMA and reviewed by me for thoroughness and accuracy.

## 2017-11-25 DIAGNOSIS — B009 Herpesviral infection, unspecified: Secondary | ICD-10-CM | POA: Diagnosis not present

## 2017-11-25 DIAGNOSIS — Q828 Other specified congenital malformations of skin: Secondary | ICD-10-CM | POA: Diagnosis not present

## 2017-11-26 DIAGNOSIS — H524 Presbyopia: Secondary | ICD-10-CM | POA: Diagnosis not present

## 2017-11-28 ENCOUNTER — Encounter: Payer: Self-pay | Admitting: Certified Nurse Midwife

## 2017-11-28 ENCOUNTER — Ambulatory Visit (INDEPENDENT_AMBULATORY_CARE_PROVIDER_SITE_OTHER): Payer: BLUE CROSS/BLUE SHIELD | Admitting: Obstetrics and Gynecology

## 2017-11-28 VITALS — BP 133/73 | HR 77 | Ht 64.0 in | Wt 120.1 lb

## 2017-11-28 DIAGNOSIS — N952 Postmenopausal atrophic vaginitis: Secondary | ICD-10-CM

## 2017-11-28 DIAGNOSIS — N941 Unspecified dyspareunia: Secondary | ICD-10-CM

## 2017-11-28 MED ORDER — ESTRADIOL 0.1 MG/GM VA CREA: 2.0000 g | TOPICAL_CREAM | Freq: Every day | VAGINAL | 2 refills | Status: DC

## 2017-11-28 NOTE — Patient Instructions (Signed)
Estradiol vaginal cream What is this medicine? ESTRADIOL (es tra DYE ole) contains the female hormone estrogen. It is used for symptoms of menopause, like vaginal dryness and irritation. This medicine may be used for other purposes; ask your health care provider or pharmacist if you have questions. COMMON BRAND NAME(S): Estrace What should I tell my health care provider before I take this medicine? They need to know if you have any of these conditions: -abnormal vaginal bleeding -blood vessel disease or blood clots -breast, cervical, endometrial, ovarian, liver, or uterine cancer -dementia -diabetes -gallbladder disease -heart disease or recent heart attack -high blood pressure -high cholesterol -high levels of calcium in the blood -hysterectomy -kidney disease -liver disease -migraine headaches -protein C deficiency -protein S deficiency -stroke -systemic lupus erythematosus (SLE) -tobacco smoker -an unusual or allergic reaction to estrogens, other hormones, soy, other medicines, foods, dyes, or preservatives -pregnant or trying to get pregnant -breast-feeding How should I use this medicine? This medicine is for use in the vagina only. Do not take by mouth. Follow the directions on the prescription label. Read package directions carefully before using. Use the special applicator supplied with the cream. Wash hands before and after use. Fill the applicator with the prescribed amount of cream. Lie on your back, part and bend your knees. Insert the applicator into the vagina and push the plunger to expel the cream into the vagina. Wash the applicator with warm soapy water and rinse well. Use exactly as directed for the complete length of time prescribed. Do not stop using except on the advice of your doctor or health care professional. A patient package insert for the product will be given with each prescription and refill. Read this sheet carefully each time. The sheet may change  frequently. Talk to your pediatrician regarding the use of this medicine in children. This medicine is not approved for use in children. Overdosage: If you think you have taken too much of this medicine contact a poison control center or emergency room at once. NOTE: This medicine is only for you. Do not share this medicine with others. What if I miss a dose? If you miss a dose, use it as soon as you can. If it is almost time for your next dose, use only that dose. Do not use double or extra doses. What may interact with this medicine? Do not take this medicine with any of the following medications: -aromatase inhibitors like aminoglutethimide, anastrozole, exemestane, letrozole, testolactone This medicine may also interact with the following medications: -barbiturates used for inducing sleep or treating seizures -carbamazepine -grapefruit juice -medicines for fungal infections like ketoconazole and itraconazole -raloxifene -rifabutin -rifampin -rifapentine -ritonavir -some antibiotics used to treat infections -St. John's Wort -tamoxifen -warfarin This list may not describe all possible interactions. Give your health care provider a list of all the medicines, herbs, non-prescription drugs, or dietary supplements you use. Also tell them if you smoke, drink alcohol, or use illegal drugs. Some items may interact with your medicine. What should I watch for while using this medicine? Visit your health care professional for regular checks on your progress. You will need a regular breast and pelvic exam. You should also discuss the need for regular mammograms with your health care professional, and follow his or her guidelines. This medicine can make your body retain fluid, making your fingers, hands, or ankles swell. Your blood pressure can go up. Contact your doctor or health care professional if you feel you are retaining fluid. If you have   any reason to think you are pregnant, stop taking  this medicine at once and contact your doctor or health care professional. Tobacco smoking increases the risk of getting a blood clot or having a stroke, especially if you are more than 61 years old. You are strongly advised not to smoke. If you wear contact lenses and notice visual changes, or if the lenses begin to feel uncomfortable, consult your eye care specialist. If you are going to have elective surgery, you may need to stop taking this medicine beforehand. Consult your health care professional for advice prior to scheduling the surgery. What side effects may I notice from receiving this medicine? Side effects that you should report to your doctor or health care professional as soon as possible: -allergic reactions like skin rash, itching or hives, swelling of the face, lips, or tongue -breast tissue changes or discharge -changes in vision -chest pain -confusion, trouble speaking or understanding -dark urine -general ill feeling or flu-like symptoms -light-colored stools -nausea, vomiting -pain, swelling, warmth in the leg -right upper belly pain -severe headaches -shortness of breath -sudden numbness or weakness of the face, arm or leg -trouble walking, dizziness, loss of balance or coordination -unusual vaginal bleeding -yellowing of the eyes or skin Side effects that usually do not require medical attention (report to your doctor or health care professional if they continue or are bothersome): -hair loss -increased hunger or thirst -increased urination -symptoms of vaginal infection like itching, irritation or unusual discharge -unusually weak or tired This list may not describe all possible side effects. Call your doctor for medical advice about side effects. You may report side effects to FDA at 1-800-FDA-1088. Where should I keep my medicine? Keep out of the reach of children. Store at room temperature between 15 and 30 degrees C (59 and 86 degrees F). Protect from  temperatures above 40 degrees C (104 degrees C). Do not freeze. Throw away any unused medicine after the expiration date. NOTE: This sheet is a summary. It may not cover all possible information. If you have questions about this medicine, talk to your doctor, pharmacist, or health care provider.  2018 Elsevier/Gold Standard (2011-02-28 09:18:12)  

## 2017-11-28 NOTE — Progress Notes (Signed)
Subjective:     Patient ID: Madison Reese, female   DOB: 08/27/1956, 61 y.o.   MRN: [id number]  HPI Here with complaint of severe pain with sex that has worsened in last 6-9 months. Is using KY gel but it does not help. States she has been through menopause for about 7-8 years after partial hysterectomy. Is having sex 1-2 times a month. Does occasionally see spotting after sex. Spouse denies feeling anything different during sex. Denies any other concerns.  Review of Systems Negative except stated above in HPI    Objective:   Physical Exam A&Ox4 Well groomed female in no distress Blood pressure 133/73, pulse 77, height 5\' 4"  (1.626 m), weight 120 lb 1.6 oz (54.5 kg). Pelvic exam: VULVA: vulvar hypopigmentation and dryness c/w atrophy, VAGINA: atrophic, pale and dry with little to no elasticity, UTERUS: surgically absent, vaginal cuff well healed, ADNEXA: normal adnexa in size, nontender and no masses, RECTAL: normal rectal, no masses.    Assessment:     Vaginal atrophy dysparenia    Plan:     Recommended estrace cream nightly x 1 month, then qohs x 1 months, then 2 times a week. Patient consents and we discussed side effects, contraindications, risks, and possible transferance to partner.  Also [facility].  RTC 6 weeks for recheck.

## 2017-12-05 ENCOUNTER — Telehealth: Payer: Self-pay | Admitting: Obstetrics and Gynecology

## 2017-12-05 ENCOUNTER — Other Ambulatory Visit: Payer: Self-pay | Admitting: *Deleted

## 2017-12-05 MED ORDER — ESTRADIOL 0.1 MG/GM VA CREA: 2.0000 g | TOPICAL_CREAM | Freq: Every day | VAGINAL | 2 refills | Status: DC

## 2017-12-05 NOTE — Telephone Encounter (Signed)
The patient called and stated that her medication is 80.00 and Melody informed the patient that she would get that price lowered. Please advise.

## 2017-12-05 NOTE — Telephone Encounter (Signed)
Done-ac 

## 2017-12-05 NOTE — Telephone Encounter (Signed)
The patient called and stated that [facility] on [address]. Please advise.

## 2017-12-24 NOTE — Telephone Encounter (Signed)
Tried calling pt several times with no success

## 2018-01-04 ENCOUNTER — Other Ambulatory Visit: Payer: Self-pay | Admitting: Physician Assistant

## 2018-01-04 DIAGNOSIS — F3342 Major depressive disorder, recurrent, in full remission: Secondary | ICD-10-CM

## 2018-01-15 ENCOUNTER — Encounter: Payer: BLUE CROSS/BLUE SHIELD | Admitting: Obstetrics and Gynecology

## 2018-01-28 DIAGNOSIS — H40033 Anatomical narrow angle, bilateral: Secondary | ICD-10-CM | POA: Diagnosis not present

## 2018-03-19 ENCOUNTER — Encounter: Payer: BLUE CROSS/BLUE SHIELD | Admitting: Obstetrics and Gynecology

## 2018-04-01 ENCOUNTER — Ambulatory Visit (INDEPENDENT_AMBULATORY_CARE_PROVIDER_SITE_OTHER): Payer: BLUE CROSS/BLUE SHIELD | Admitting: Obstetrics and Gynecology

## 2018-04-01 ENCOUNTER — Encounter: Payer: Self-pay | Admitting: Obstetrics and Gynecology

## 2018-04-01 VITALS — BP 136/76 | HR 69 | Ht 63.0 in | Wt 116.8 lb

## 2018-04-01 DIAGNOSIS — N941 Unspecified dyspareunia: Secondary | ICD-10-CM | POA: Diagnosis not present

## 2018-04-01 MED ORDER — ESTRADIOL 0.1 MG/GM VA CREA: 2.0000 g | TOPICAL_CREAM | Freq: Every day | VAGINAL | 2 refills | Status: DC

## 2018-04-01 NOTE — Progress Notes (Signed)
Subjective:     Patient ID: Madison Reese, female   DOB: 01/04/1956, 62 y.o.   MRN: [id number]  HPI Seen in December for severe dysparenia secondary to vaginal atrophy. Used the estrace for 2 months and ran out of it. Didn't get refill due to cost. Hasn't had sex since December.  Denies any concerns   Review of Systems negative    Objective:   Physical Exam A&Ix4 Well groomed female in no distress Blood pressure 136/76, pulse 69, height 5\' 3"  (1.6 m), weight 116 lb 12.8 oz (53 kg). Pelvic exam: VULVA: normal atrophic appearing vulva with no masses, tenderness or lesions, VAGINA: normal appearing vagina with normal color and discharge, no lesions, atrophic.    Assessment:     dysparenia follow up    Plan:     Samples given and will search for new discount coupon. To restart nightly for 4 weeks, then every other night for 4 weeks then 2 times a week. RTC as needed or in December.  Melody Shambley,CNM

## 2018-10-21 ENCOUNTER — Other Ambulatory Visit: Payer: Self-pay | Admitting: Physician Assistant

## 2018-10-22 ENCOUNTER — Other Ambulatory Visit: Payer: Self-pay | Admitting: Physician Assistant

## 2018-10-22 DIAGNOSIS — Z1231 Encounter for screening mammogram for malignant neoplasm of breast: Secondary | ICD-10-CM

## 2018-10-27 ENCOUNTER — Other Ambulatory Visit: Payer: Self-pay

## 2018-10-27 DIAGNOSIS — F3342 Major depressive disorder, recurrent, in full remission: Secondary | ICD-10-CM

## 2018-10-27 MED ORDER — PAROXETINE HCL 40 MG PO TABS: 40.0000 mg | ORAL_TABLET | Freq: Every morning | ORAL | 5 refills | Status: DC

## 2018-10-30 ENCOUNTER — Ambulatory Visit
Admission: RE | Admit: 2018-10-30 | Discharge: 2018-10-30 | Disposition: A | Discharge status: Home / Self Care | Payer: BLUE CROSS/BLUE SHIELD | Source: Ambulatory Visit | Attending: Physician Assistant | Admitting: Physician Assistant

## 2018-10-30 DIAGNOSIS — Z1231 Encounter for screening mammogram for malignant neoplasm of breast: Secondary | ICD-10-CM | POA: Diagnosis not present

## 2018-10-31 ENCOUNTER — Telehealth: Payer: Self-pay

## 2018-10-31 NOTE — Telephone Encounter (Signed)
-----   Message from Jennifer M Burnette, PA-C sent at 10/31/2018  8:20 AM EST ----- Normal mammogram. Repeat screening in one year. 

## 2018-10-31 NOTE — Telephone Encounter (Signed)
[facility] 

## 2018-10-31 NOTE — Telephone Encounter (Signed)
Patient advised as below.  

## 2018-11-24 ENCOUNTER — Telehealth: Payer: Self-pay

## 2018-11-24 ENCOUNTER — Encounter: Payer: Self-pay | Admitting: Physician Assistant

## 2018-11-24 ENCOUNTER — Ambulatory Visit (INDEPENDENT_AMBULATORY_CARE_PROVIDER_SITE_OTHER): Payer: BLUE CROSS/BLUE SHIELD | Admitting: Physician Assistant

## 2018-11-24 VITALS — BP 139/80 | HR 70 | Temp 98.1°F | Resp 16 | Wt 117.4 lb

## 2018-11-24 DIAGNOSIS — E7800 Pure hypercholesterolemia, unspecified: Secondary | ICD-10-CM | POA: Diagnosis not present

## 2018-11-24 DIAGNOSIS — N941 Unspecified dyspareunia: Secondary | ICD-10-CM

## 2018-11-24 DIAGNOSIS — Z1211 Encounter for screening for malignant neoplasm of colon: Secondary | ICD-10-CM

## 2018-11-24 DIAGNOSIS — E559 Vitamin D deficiency, unspecified: Secondary | ICD-10-CM

## 2018-11-24 DIAGNOSIS — F419 Anxiety disorder, unspecified: Secondary | ICD-10-CM

## 2018-11-24 DIAGNOSIS — Z0000 Encounter for general adult medical examination without abnormal findings: Secondary | ICD-10-CM | POA: Diagnosis not present

## 2018-11-24 DIAGNOSIS — F3342 Major depressive disorder, recurrent, in full remission: Secondary | ICD-10-CM

## 2018-11-24 DIAGNOSIS — Z23 Encounter for immunization: Secondary | ICD-10-CM

## 2018-11-24 MED ORDER — ALPRAZOLAM 0.5 MG PO TABS: 0.5000 mg | ORAL_TABLET | Freq: Three times a day (TID) | ORAL | 1 refills | Status: DC | PRN

## 2018-11-24 MED ORDER — PAROXETINE HCL 40 MG PO TABS: 40.0000 mg | ORAL_TABLET | Freq: Every morning | ORAL | 1 refills | Status: DC

## 2018-11-24 MED ORDER — ESTROGENS, CONJUGATED 0.625 MG/GM VA CREA: 1.0000 | TOPICAL_CREAM | VAGINAL | 12 refills | Status: DC

## 2018-11-24 MED ORDER — ESTROGENS, CONJUGATED 0.625 MG/GM VA CREA: 1.0000 | TOPICAL_CREAM | Freq: Every day | VAGINAL | 12 refills | Status: DC

## 2018-11-24 NOTE — Telephone Encounter (Signed)
[facility] advised as below

## 2018-11-24 NOTE — Telephone Encounter (Signed)
Once or twice weekly updated

## 2018-11-24 NOTE — Telephone Encounter (Signed)
Madison Reese with [facility] wants to know if patient is to use estrace vaginal cream daily? Please advise. Cb# [phone]. Thank you sd

## 2018-11-24 NOTE — Progress Notes (Signed)
Patient: Madison Reese Cliff, Female    DOB: 05/20/1956, 62 y.o.   MRN: [id number] Visit Date: 11/24/2018  Today's Provider: Margaretann LovelessJennifer M , PA-Reese   Chief Complaint  Patient presents with  . Annual Exam   Subjective:    Annual physical exam Madison Reese Rom is a 62 y.o. female who presents today for health maintenance and complete physical. She feels well. She reports exercising includes walking. She reports she is sleeping well. -----------------------------------------------------------------   Review of Systems  Constitutional: Negative.   HENT: Negative.   Eyes: Negative.   Respiratory: Negative.   Cardiovascular: Negative.   Gastrointestinal: Negative.   Endocrine: Negative.   Genitourinary: Negative.   Musculoskeletal: Negative.   Skin: Negative.   Allergic/Immunologic: Negative.   Neurological: Negative.   Hematological: Negative.   Psychiatric/Behavioral: Negative.     Social History She  reports that she has never smoked. She has never used smokeless tobacco. She reports that she does not drink alcohol or use drugs. Social History   Socioeconomic History  . Marital status: Married    Spouse name: Not on file  . Number of children: Not on file  . Years of education: Not on file  . Highest education level: Not on file  Occupational History  . Not on file  Social Needs  . Financial resource strain: Not on file  . Food insecurity:    Worry: Not on file    Inability: Not on file  . Transportation needs:    Medical: Not on file    Non-medical: Not on file  Tobacco Use  . Smoking status: Never Smoker  . Smokeless tobacco: Never Used  Substance and Sexual Activity  . Alcohol use: No  . Drug use: No  . Sexual activity: Yes  Lifestyle  . Physical activity:    Days per week: Not on file    Minutes per session: Not on file  . Stress: Not on file  Relationships  . Social connections:    Talks on phone: Not on file    Gets together: Not on  file    Attends religious service: Not on file    Active member of club or organization: Not on file    Attends meetings of clubs or organizations: Not on file    Relationship status: Not on file  Other Topics Concern  . Not on file  Social History Narrative  . Not on file    Patient Active Problem List   Diagnosis Date Noted  . 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 Stress incontinence in female 06/03/2015  . Anxiety 04/11/2015  . 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 Hypercholesteremia 04/11/2015  . Pectus excavatum 04/11/2015  . Restless legs syndrome 04/11/2015  . Avitaminosis D 04/11/2015    Past Surgical History:  Procedure Laterality Date  . ABDOMINAL HYSTERECTOMY  2008   Partial- due to menorrhagia  . FOOT SURGERY Bilateral 02/2005   removal of 5th digit on bilateral feet  . TUBAL LIGATION  1995    Family History  Family Status  Relation Name Status  . Mother  Deceased  . Father  Deceased at age 62  . Sister  Alive  . Brother  Alive  . Mat Uncle  Deceased  . MGM  Deceased  . Sister  Alive  . Neg Hx  (Not Specified)   Her family history includes Alzheimer's disease in her maternal grandmother; Arthritis in her mother; Cancer (age of  onset: 53) in her mother; Depression in her mother and sister; Diabetes in her maternal uncle; Heart attack in her father; Hyperlipidemia in her sister; Hypertension in her brother and mother; Lung cancer in her maternal uncle.     No Known Allergies  Previous Medications   ALPRAZOLAM (XANAX) 0.5 MG TABLET    Take 1 tablet (0.5 mg total) by mouth 3 (three) times daily as needed for anxiety.   CALCIUM CARBONATE-VIT D-MIN PO    Take by mouth.   DOXYCYCLINE (VIBRA-TABS) 100 MG TABLET    Take 1 tablet (100 mg total) by mouth 2 (two) times daily.   ESTRADIOL (ESTRACE VAGINAL) 0.1 MG/GM VAGINAL CREAM    Place 2 g vaginally daily.   LACTOBACILLUS (ULTIMATE PROBIOTIC FORMULA) CAPS    Take by mouth.    MULTIPLE VITAMIN TABLET    Take by mouth.   NUTRITIONAL SUPPLEMENTS (IMMUNE ENHANCE) TABS    Take by mouth.   OMEGA-3 FATTY ACIDS (FISH OIL) 1200 MG CAPS    Take by mouth.   PAROXETINE (PAXIL) 40 MG TABLET    Take 1 tablet (40 mg total) by mouth every morning.   VALACYCLOVIR (VALTREX) 500 MG TABLET    Take 1 tablet (500 mg total) by mouth daily.    Patient Care Team: Reine Just as PCP - General (Physician Assistant)      Objective:   Vitals: BP 139/80 (BP Location: Left Arm, Patient Position: Sitting, Cuff Size: Normal)   Pulse 70   Temp 98.1 F (36.7 Reese) (Oral)   Resp 16   Wt 117 lb 6.4 oz (53.3 kg)   SpO2 98%   BMI 20.80 kg/m    Physical Exam Vitals signs reviewed.  Constitutional:      General: She is not in acute distress.    Appearance: She is well-developed and normal weight. She is not diaphoretic.  HENT:     Head: Normocephalic and atraumatic.     Right Ear: Hearing, tympanic membrane, ear canal and external ear normal.     Left Ear: Hearing, tympanic membrane, ear canal and external ear normal.     Nose: Nose normal.     Mouth/Throat:     Dentition: Normal dentition.     Tongue: No lesions.     Pharynx: Oropharynx is clear. Uvula midline. No oropharyngeal exudate.  Eyes:     General: No scleral icterus.       Right eye: No discharge.        Left eye: No discharge.     Conjunctiva/sclera: Conjunctivae normal.     Pupils: Pupils are equal, round, and reactive to light.  Neck:     Musculoskeletal: Normal range of motion and neck supple.     Thyroid: No thyromegaly.     Vascular: No carotid bruit or JVD.     Trachea: No tracheal deviation.  Cardiovascular:     Rate and Rhythm: Normal rate and regular rhythm.     Heart sounds: Normal heart sounds. No murmur. No friction rub. No gallop.   Pulmonary:     Effort: Pulmonary effort is normal. No respiratory distress.     Breath sounds: Normal breath sounds. No wheezing or rales.  Chest:     Chest  wall: No tenderness.  Abdominal:     General: Bowel sounds are normal. There is no distension.     Palpations: Abdomen is soft. There is no mass.     Tenderness: There is no abdominal tenderness. There  is no guarding or rebound.  Musculoskeletal: Normal range of motion.        General: No tenderness.  Lymphadenopathy:     Cervical: No cervical adenopathy.  Skin:    General: Skin is warm and dry.     Findings: No rash.  Neurological:     Mental Status: She is alert and oriented to person, place, and time.  Psychiatric:        Behavior: Behavior normal.        Thought Content: Thought content normal.        Judgment: Judgment normal.      Depression Screen PHQ 2/9 Scores 11/24/2018 06/03/2015  PHQ - 2 Score 0 0  PHQ- 9 Score 0 -      Assessment & Plan:     Routine Health Maintenance and Physical Exam  Exercise Activities and Dietary recommendations Goals   None     Immunization History  Administered Date(s) Administered  . Tdap 10/01/2007    Health Maintenance  Topic Date Due  . Janet Berlin  09/30/2017  . COLONOSCOPY  11/25/2017  . INFLUENZA VACCINE  07/10/2018  . PAP SMEAR-Modifier  11/27/2019  . MAMMOGRAM  10/30/2020  . Hepatitis Reese Screening  Completed  . HIV Screening  Completed     Discussed health benefits of physical activity, and encouraged her to engage in regular exercise appropriate for her age and condition.    1. Annual physical exam Normal physical exam today. Will check labs as below and f/u pending lab results. If labs are stable and WNL she will not need to have these rechecked for one year at her next annual physical exam. She is to call the office in the meantime if she has any acute issue, questions or concerns. - CBC with Differential/Platelet - Comprehensive metabolic panel - Hemoglobin A1c - Lipid panel - TSH  2. Colon cancer screening Last colonoscopy was in 2008 and normal, no polyps. No family history of colon cancer.  Cologuard ordered.  - Cologuard  3. Avitaminosis D H/O this, postmenopausal. Will check labs as below and f/u pending results. - Vitamin D (25 hydroxy)  4. Hypercholesteremia Diet controlled. Will check labs as below and f/u pending results. - Comprehensive metabolic panel - Hemoglobin A1c - Lipid panel  5. Dyspareunia in female Premarin cream sent in for patient to use once or twice weekly.   6. Need for Td vaccine Vaccine given to patient without complications. Patient sat for 15 minutes after administration and was tolerated well without adverse effects. - Td : Tetanus/diphtheria >7yo Preservative  free  7. Anxiety Stable. Diagnosis pulled for medication refill. Continue current medical treatment plan. - ALPRAZolam (XANAX) 0.5 MG tablet; Take 1 tablet (0.5 mg total) by mouth 3 (three) times daily as needed for anxiety.  Dispense: 90 tablet; Refill: 1  8. Recurrent major depressive disorder, in full remission (HCC) Stable. Diagnosis pulled for medication refill. Continue current medical treatment plan. - PARoxetine (PAXIL) 40 MG tablet; Take 1 tablet (40 mg total) by mouth every morning.  Dispense: 90 tablet; Refill: 1  --------------------------------------------------------------------

## 2018-11-24 NOTE — Patient Instructions (Signed)
Health Maintenance for Postmenopausal Women Menopause is a normal process in which your reproductive ability comes to an end. This process happens gradually over a span of months to years, usually between the ages of 22 and 9. Menopause is complete when you have missed 12 consecutive menstrual periods. It is important to talk with your health care provider about some of the most common conditions that affect postmenopausal women, such as heart disease, cancer, and bone loss (osteoporosis). Adopting a healthy lifestyle and getting preventive care can help to promote your health and wellness. Those actions can also lower your chances of developing some of these common conditions. What should I know about menopause? During menopause, you may experience a number of symptoms, such as:  Moderate-to-severe hot flashes.  Night sweats.  Decrease in sex drive.  Mood swings.  Headaches.  Tiredness.  Irritability.  Memory problems.  Insomnia.  Choosing to treat or not to treat menopausal changes is an individual decision that you make with your health care provider. What should I know about hormone replacement therapy and supplements? Hormone therapy products are effective for treating symptoms that are associated with menopause, such as hot flashes and night sweats. Hormone replacement carries certain risks, especially as you become older. If you are thinking about using estrogen or estrogen with progestin treatments, discuss the benefits and risks with your health care provider. What should I know about heart disease and stroke? Heart disease, heart attack, and stroke become more likely as you age. This may be due, in part, to the hormonal changes that your body experiences during menopause. These can affect how your body processes dietary fats, triglycerides, and cholesterol. Heart attack and stroke are both medical emergencies. There are many things that you can do to help prevent heart disease  and stroke:  Have your blood pressure checked at least every 1-2 years. High blood pressure causes heart disease and increases the risk of stroke.  If you are 53-22 years old, ask your health care provider if you should take aspirin to prevent a heart attack or a stroke.  Do not use any tobacco products, including cigarettes, chewing tobacco, or electronic cigarettes. If you need help quitting, ask your health care provider.  It is important to eat a healthy diet and maintain a healthy weight. ? Be sure to include plenty of vegetables, fruits, low-fat dairy products, and lean protein. ? Avoid eating foods that are high in solid fats, added sugars, or salt (sodium).  Get regular exercise. This is one of the most important things that you can do for your health. ? Try to exercise for at least 150 minutes each week. The type of exercise that you do should increase your heart rate and make you sweat. This is known as moderate-intensity exercise. ? Try to do strengthening exercises at least twice each week. Do these in addition to the moderate-intensity exercise.  Know your numbers.Ask your health care provider to check your cholesterol and your blood glucose. Continue to have your blood tested as directed by your health care provider.  What should I know about cancer screening? There are several types of cancer. Take the following steps to reduce your risk and to catch any cancer development as early as possible. Breast Cancer  Practice breast self-awareness. ? This means understanding how your breasts normally appear and feel. ? It also means doing regular breast self-exams. Let your health care provider know about any changes, no matter how small.  If you are 40  or older, have a clinician do a breast exam (clinical breast exam or CBE) every year. Depending on your age, family history, and medical history, it may be recommended that you also have a yearly breast X-ray (mammogram).  If you  have a family history of breast cancer, talk with your health care provider about genetic screening.  If you are at high risk for breast cancer, talk with your health care provider about having an MRI and a mammogram every year.  Breast cancer (BRCA) gene test is recommended for women who have family members with BRCA-related cancers. Results of the assessment will determine the need for genetic counseling and BRCA1 and for BRCA2 testing. BRCA-related cancers include these types: ? Breast. This occurs in males or females. ? Ovarian. ? Tubal. This may also be called fallopian tube cancer. ? Cancer of the abdominal or pelvic lining (peritoneal cancer). ? Prostate. ? Pancreatic.  Cervical, Uterine, and Ovarian Cancer Your health care provider may recommend that you be screened regularly for cancer of the pelvic organs. These include your ovaries, uterus, and vagina. This screening involves a pelvic exam, which includes checking for microscopic changes to the surface of your cervix (Pap test).  For women ages 21-65, health care providers may recommend a pelvic exam and a Pap test every three years. For women ages 79-65, they may recommend the Pap test and pelvic exam, combined with testing for human papilloma virus (HPV), every five years. Some types of HPV increase your risk of cervical cancer. Testing for HPV may also be done on women of any age who have unclear Pap test results.  Other health care providers may not recommend any screening for nonpregnant women who are considered low risk for pelvic cancer and have no symptoms. Ask your health care provider if a screening pelvic exam is right for you.  If you have had past treatment for cervical cancer or a condition that could lead to cancer, you need Pap tests and screening for cancer for at least 20 years after your treatment. If Pap tests have been discontinued for you, your risk factors (such as having a new sexual partner) need to be  reassessed to determine if you should start having screenings again. Some women have medical problems that increase the chance of getting cervical cancer. In these cases, your health care provider may recommend that you have screening and Pap tests more often.  If you have a family history of uterine cancer or ovarian cancer, talk with your health care provider about genetic screening.  If you have vaginal bleeding after reaching menopause, tell your health care provider.  There are currently no reliable tests available to screen for ovarian cancer.  Lung Cancer Lung cancer screening is recommended for adults 69-62 years old who are at high risk for lung cancer because of a history of smoking. A yearly low-dose CT scan of the lungs is recommended if you:  Currently smoke.  Have a history of at least 30 pack-years of smoking and you currently smoke or have quit within the past 15 years. A pack-year is smoking an average of one pack of cigarettes per day for one year.  Yearly screening should:  Continue until it has been 15 years since you quit.  Stop if you develop a health problem that would prevent you from having lung cancer treatment.  Colorectal Cancer  This type of cancer can be detected and can often be prevented.  Routine colorectal cancer screening usually begins at  age 42 and continues through age 45.  If you have risk factors for colon cancer, your health care provider may recommend that you be screened at an earlier age.  If you have a family history of colorectal cancer, talk with your health care provider about genetic screening.  Your health care provider may also recommend using home test kits to check for hidden blood in your stool.  A small camera at the end of a tube can be used to examine your colon directly (sigmoidoscopy or colonoscopy). This is done to check for the earliest forms of colorectal cancer.  Direct examination of the colon should be repeated every  5-10 years until age 71. However, if early forms of precancerous polyps or small growths are found or if you have a family history or genetic risk for colorectal cancer, you may need to be screened more often.  Skin Cancer  Check your skin from head to toe regularly.  Monitor any moles. Be sure to tell your health care provider: ? About any new moles or changes in moles, especially if there is a change in a mole's shape or color. ? If you have a mole that is larger than the size of a pencil eraser.  If any of your family members has a history of skin cancer, especially at a young age, talk with your health care provider about genetic screening.  Always use sunscreen. Apply sunscreen liberally and repeatedly throughout the day.  Whenever you are outside, protect yourself by wearing long sleeves, pants, a wide-brimmed hat, and sunglasses.  What should I know about osteoporosis? Osteoporosis is a condition in which bone destruction happens more quickly than new bone creation. After menopause, you may be at an increased risk for osteoporosis. To help prevent osteoporosis or the bone fractures that can happen because of osteoporosis, the following is recommended:  If you are 46-71 years old, get at least 1,000 mg of calcium and at least 600 mg of vitamin D per day.  If you are older than age 55 but younger than age 65, get at least 1,200 mg of calcium and at least 600 mg of vitamin D per day.  If you are older than age 54, get at least 1,200 mg of calcium and at least 800 mg of vitamin D per day.  Smoking and excessive alcohol intake increase the risk of osteoporosis. Eat foods that are rich in calcium and vitamin D, and do weight-bearing exercises several times each week as directed by your health care provider. What should I know about how menopause affects my mental health? Depression may occur at any age, but it is more common as you become older. Common symptoms of depression  include:  Low or sad mood.  Changes in sleep patterns.  Changes in appetite or eating patterns.  Feeling an overall lack of motivation or enjoyment of activities that you previously enjoyed.  Frequent crying spells.  Talk with your health care provider if you think that you are experiencing depression. What should I know about immunizations? It is important that you get and maintain your immunizations. These include:  Tetanus, diphtheria, and pertussis (Tdap) booster vaccine.  Influenza every year before the flu season begins.  Pneumonia vaccine.  Shingles vaccine.  Your health care provider may also recommend other immunizations. This information is not intended to replace advice given to you by your health care provider. Make sure you discuss any questions you have with your health care provider. Document Released: 01/18/2006  Document Revised: 06/15/2016 Document Reviewed: 08/30/2015 Elsevier Interactive Patient Education  2018 [employer] Inc[employer]  

## 2018-11-25 ENCOUNTER — Telehealth: Payer: Self-pay

## 2018-11-25 DIAGNOSIS — B009 Herpesviral infection, unspecified: Secondary | ICD-10-CM | POA: Diagnosis not present

## 2018-11-25 DIAGNOSIS — L821 Other seborrheic keratosis: Secondary | ICD-10-CM | POA: Diagnosis not present

## 2018-11-25 DIAGNOSIS — L814 Other melanin hyperpigmentation: Secondary | ICD-10-CM | POA: Diagnosis not present

## 2018-11-25 DIAGNOSIS — L820 Inflamed seborrheic keratosis: Secondary | ICD-10-CM | POA: Diagnosis not present

## 2018-11-25 LAB — CBC WITH DIFFERENTIAL/PLATELET
BASOS: 1 %
Basophils Absolute: 0.1 10*3/uL (ref 0.0–0.2)
EOS (ABSOLUTE): 0.2 10*3/uL (ref 0.0–0.4)
Eos: 3 %
Hematocrit: 37.8 % (ref 34.0–46.6)
Hemoglobin: 12.9 g/dL (ref 11.1–15.9)
IMMATURE GRANULOCYTES: 0 %
Immature Grans (Abs): 0 10*3/uL (ref 0.0–0.1)
LYMPHS ABS: 1.2 10*3/uL (ref 0.7–3.1)
Lymphs: 24 %
MCH: 28.5 pg (ref 26.6–33.0)
MCHC: 34.1 g/dL (ref 31.5–35.7)
MCV: 84 fL (ref 79–97)
MONOS ABS: 0.3 10*3/uL (ref 0.1–0.9)
Monocytes: 6 %
NEUTROS PCT: 66 %
Neutrophils Absolute: 3.2 10*3/uL (ref 1.4–7.0)
PLATELETS: 248 10*3/uL (ref 150–450)
RBC: 4.52 x10E6/uL (ref 3.77–5.28)
RDW: 12.8 % (ref 12.3–15.4)
WBC: 4.9 10*3/uL (ref 3.4–10.8)

## 2018-11-25 LAB — HEMOGLOBIN A1C
Est. average glucose Bld gHb Est-mCnc: 117 mg/dL
Hgb A1c MFr Bld: 5.7 % — ABNORMAL HIGH (ref 4.8–5.6)

## 2018-11-25 LAB — COMPREHENSIVE METABOLIC PANEL
A/G RATIO: 2 (ref 1.2–2.2)
ALK PHOS: 98 IU/L (ref 39–117)
ALT: 22 IU/L (ref 0–32)
AST: 23 IU/L (ref 0–40)
Albumin: 4.6 g/dL (ref 3.6–4.8)
BILIRUBIN TOTAL: 0.4 mg/dL (ref 0.0–1.2)
BUN/Creatinine Ratio: 22 (ref 12–28)
BUN: 21 mg/dL (ref 8–27)
CALCIUM: 9.8 mg/dL (ref 8.7–10.3)
CHLORIDE: 101 mmol/L (ref 96–106)
CO2: 22 mmol/L (ref 20–29)
Creatinine, Ser: 0.97 mg/dL (ref 0.57–1.00)
GFR calc Af Amer: 72 mL/min/{1.73_m2} (ref >59)
GFR calc non Af Amer: 63 mL/min/{1.73_m2} (ref >59)
Globulin, Total: 2.3 g/dL (ref 1.5–4.5)
Glucose: 65 mg/dL (ref 65–99)
POTASSIUM: 3.8 mmol/L (ref 3.5–5.2)
SODIUM: 142 mmol/L (ref 134–144)
Total Protein: 6.9 g/dL (ref 6.0–8.5)

## 2018-11-25 LAB — LIPID PANEL
Chol/HDL Ratio: 2.2 ratio (ref 0.0–4.4)
Cholesterol, Total: 228 mg/dL — ABNORMAL HIGH (ref 100–199)
HDL: 105 mg/dL (ref >39)
LDL Calculated: 115 mg/dL — ABNORMAL HIGH (ref 0–99)
Triglycerides: 42 mg/dL (ref 0–149)
VLDL Cholesterol Cal: 8 mg/dL (ref 5–40)

## 2018-11-25 LAB — VITAMIN D 25 HYDROXY (VIT D DEFICIENCY, FRACTURES): Vit D, 25-Hydroxy: 37.9 ng/mL (ref 30.0–100.0)

## 2018-11-25 LAB — TSH: TSH: 2.55 u[IU]/mL (ref 0.450–4.500)

## 2018-11-25 NOTE — Telephone Encounter (Signed)
-----   Message from Margaretann LovelessJennifer M Burnette, [place]-C sent at 11/25/2018  7:42 AM EST ----- Blood count is normal. Kidney and liver function are normal. Sodium and potassium are normal. A1c up just slightly from 5.6 to 5.7. Cholesterol also up from 2 years ago. Can add fish oil back if she has stopped taking. If still taking really work on dietary habits and make sure to limit fatty foods, processed foods, simple carbs and sugars, and red meat. Keep up physical activity as well. Thyroid is normal. Vit D is normal.

## 2018-11-25 NOTE — Telephone Encounter (Signed)
Patient advised as below.  

## 2019-02-17 ENCOUNTER — Encounter: Payer: Self-pay | Admitting: Physician Assistant

## 2019-02-17 ENCOUNTER — Ambulatory Visit (INDEPENDENT_AMBULATORY_CARE_PROVIDER_SITE_OTHER): Payer: BLUE CROSS/BLUE SHIELD | Admitting: Physician Assistant

## 2019-02-17 VITALS — BP 149/83 | HR 73 | Temp 98.6°F | Wt 117.4 lb

## 2019-02-17 DIAGNOSIS — J0110 Acute frontal sinusitis, unspecified: Secondary | ICD-10-CM | POA: Diagnosis not present

## 2019-02-17 MED ORDER — AMOXICILLIN-POT CLAVULANATE 875-125 MG PO TABS: 1.0000 | ORAL_TABLET | Freq: Two times a day (BID) | ORAL | 0 refills | Status: AC

## 2019-02-17 NOTE — Progress Notes (Signed)
Patient: Madison Reese Female    DOB: 1956-03-17   63 y.o.   MRN: [id number] Visit Date: 02/17/2019  Today's Provider: Trey Sailors, PA-C   Chief Complaint  Patient presents with  . URI   Subjective:     URI   The current episode started 1 to 4 weeks ago. The problem has been gradually worsening. There has been no fever. Associated symptoms include congestion, coughing, headaches, sneezing and a sore throat. She has tried decongestant and acetaminophen for the symptoms. The treatment provided mild relief.   Had fever, sore throat, cough one week ago. This has progressed into right sided facial pain and pressure.   No Known Allergies   Current Outpatient Medications:  .  ALPRAZolam (XANAX) 0.5 MG tablet, Take 1 tablet (0.5 mg total) by mouth 3 (three) times daily as needed for anxiety., Disp: 90 tablet, Rfl: 1 .  CALCIUM CARBONATE-VIT D-MIN PO, Take by mouth., Disp: , Rfl:  .  conjugated estrogens (PREMARIN) vaginal cream, Place 1 Applicatorful vaginally once a week., Disp: 42.5 g, Rfl: 12 .  Lactobacillus (ULTIMATE PROBIOTIC FORMULA) CAPS, Take by mouth., Disp: , Rfl:  .  Multiple Vitamin tablet, Take by mouth., Disp: , Rfl:  .  Nutritional Supplements (IMMUNE ENHANCE) TABS, Take by mouth., Disp: , Rfl:  .  Omega-3 Fatty Acids (FISH OIL) 1200 MG CAPS, Take by mouth., Disp: , Rfl:  .  PARoxetine (PAXIL) 40 MG tablet, Take 1 tablet (40 mg total) by mouth every morning., Disp: 90 tablet, Rfl: 1 .  valACYclovir (VALTREX) 500 MG tablet, Take 1 tablet (500 mg total) by mouth daily., Disp: 30 tablet, Rfl: 5 .  amoxicillin-clavulanate (AUGMENTIN) 875-125 MG tablet, Take 1 tablet by mouth 2 (two) times daily for 10 days., Disp: 20 tablet, Rfl: 0  Review of Systems  Constitutional: Positive for appetite change.  HENT: Positive for congestion, postnasal drip, sneezing and sore throat.   Respiratory: Positive for cough.   Gastrointestinal: Negative.   Neurological: Positive  for dizziness, light-headedness and headaches.    Social History   Tobacco Use  . Smoking status: Never Smoker  . Smokeless tobacco: Never Used  Substance Use Topics  . Alcohol use: No      Objective:   BP (!) 149/83 (BP Location: Left Arm, Patient Position: Sitting, Cuff Size: Normal)   Pulse 73   Temp 98.6 F (37 C) (Oral)   Wt 117 lb 6.4 oz (53.3 kg)   SpO2 99%   BMI 20.80 kg/m  Vitals:   02/17/19 0822  BP: (!) 149/83  Pulse: 73  Temp: 98.6 F (37 C)  TempSrc: Oral  SpO2: 99%  Weight: 117 lb 6.4 oz (53.3 kg)     Physical Exam Constitutional:      General: She is not in acute distress.    Appearance: She is well-developed. She is not diaphoretic.  HENT:     Right Ear: Tympanic membrane and external ear normal.     Left Ear: Tympanic membrane and external ear normal.     Nose:     Right Sinus: Maxillary sinus tenderness and frontal sinus tenderness present.     Left Sinus: No maxillary sinus tenderness or frontal sinus tenderness.     Mouth/Throat:     Pharynx: No oropharyngeal exudate or posterior oropharyngeal erythema.  Eyes:     General:        Right eye: No discharge.  Left eye: No discharge.     Conjunctiva/sclera: Conjunctivae normal.  Neck:     Musculoskeletal: Neck supple.  Cardiovascular:     Rate and Rhythm: Normal rate and regular rhythm.  Pulmonary:     Effort: Pulmonary effort is normal.     Breath sounds: Normal breath sounds.  Lymphadenopathy:     Cervical: Cervical adenopathy present.  Skin:    General: Skin is warm and dry.  Neurological:     Mental Status: She is alert and oriented to person, place, and time.  Psychiatric:        Behavior: Behavior normal.         Assessment & Plan    1. Acute non-recurrent frontal sinusitis  Counseled on using 2nd gen antihistamine.   - amoxicillin-clavulanate (AUGMENTIN) 875-125 MG tablet; Take 1 tablet by mouth 2 (two) times daily for 10 days.  Dispense: 20 tablet; Refill: 0     The entirety of the information documented in the History of Present Illness, Review of Systems and Physical Exam were personally obtained by me. Portions of this information were initially documented by [facility], CMA and reviewed by me for thoroughness and accuracy.          Trey Sailors, PA-C  [facility]

## 2019-02-17 NOTE — Patient Instructions (Signed)

## 2019-04-21 DIAGNOSIS — H2513 Age-related nuclear cataract, bilateral: Secondary | ICD-10-CM | POA: Diagnosis not present

## 2019-04-21 DIAGNOSIS — H25043 Posterior subcapsular polar age-related cataract, bilateral: Secondary | ICD-10-CM | POA: Diagnosis not present

## 2019-04-21 DIAGNOSIS — H2512 Age-related nuclear cataract, left eye: Secondary | ICD-10-CM | POA: Diagnosis not present

## 2019-04-21 DIAGNOSIS — H18413 Arcus senilis, bilateral: Secondary | ICD-10-CM | POA: Diagnosis not present

## 2019-04-21 DIAGNOSIS — H25013 Cortical age-related cataract, bilateral: Secondary | ICD-10-CM | POA: Diagnosis not present

## 2019-04-27 DIAGNOSIS — H2512 Age-related nuclear cataract, left eye: Secondary | ICD-10-CM | POA: Diagnosis not present

## 2019-04-28 DIAGNOSIS — H2511 Age-related nuclear cataract, right eye: Secondary | ICD-10-CM | POA: Diagnosis not present

## 2019-07-09 DIAGNOSIS — H2511 Age-related nuclear cataract, right eye: Secondary | ICD-10-CM | POA: Diagnosis not present

## 2019-07-10 DIAGNOSIS — H2511 Age-related nuclear cataract, right eye: Secondary | ICD-10-CM | POA: Diagnosis not present

## 2019-10-02 ENCOUNTER — Other Ambulatory Visit: Payer: Self-pay

## 2019-10-02 DIAGNOSIS — F3342 Major depressive disorder, recurrent, in full remission: Secondary | ICD-10-CM

## 2019-10-02 MED ORDER — PAROXETINE HCL 40 MG PO TABS: 40.0000 mg | ORAL_TABLET | Freq: Every morning | ORAL | 1 refills | Status: DC

## 2019-11-20 ENCOUNTER — Ambulatory Visit: Payer: Self-pay

## 2019-11-20 NOTE — Telephone Encounter (Signed)
Patient called stating that she had an positive exposure to her sister who has now tested positive for COVID-19.  She states they traveled by car and were together an hour on Sunday not wearing mask. Today her sisters test came back positive. Patient was advised to say home and quarantine and test on Monday. She was also advised to have her husband should test as well. He was not in the car with them on Sunday. She was informed symptoms  start up to14 days post exposure and average about 7 days. Care advice was read to patient.  She verbalized understanding of all information. Note will be routed to [facility] PA-c  Reason for Disposition . [1] CLOSE CONTACT COVID-19 EXPOSURE within last 14 days AND [2] NO symptoms  Answer Assessment - Initial Assessment Questions 1. COVID-19 CLOSE CONTACT: "Who is the person with the confirmed or suspected COVID-19 infection that you were exposed to?"     sister 2. PLACE of CONTACT: "Where were you when you were exposed to COVID-19?" (e.g., home, school, medical waiting room; which city?)    Car trip 3. TYPE of CONTACT: "How much contact was there?" (e.g., sitting next to, live in same house, work in same office, same building)    Sitting next to 4. DURATION of CONTACT: "How long were you in contact with the COVID-19 patient?" (e.g., a few seconds, passed by person, a few minutes, 15 minutes or longer, live with the patient)     1hour 5. MASK: "Were you wearing a mask?" "Was the other person wearing a mask?" Note: wearing a mask reduces the risk of an  otherwise close contact.    no 6. DATE of CONTACT: "When did you have contact with a COVID-19 patient?" (e.g., how many days ago)    Sunday 6th 7. COMMUNITY SPREAD: "Are there lots of cases of COVID-19 (community spread) where you live?" (See public health department website, if unsure)       [place] 8. SYMPTOMS: "Do you have any symptoms?" (e.g., fever, cough, breathing difficulty, loss of taste or  smell)     no 9. PREGNANCY OR POSTPARTUM: "Is there any chance you are pregnant?" "When was your last menstrual period?" "Did you deliver in the last 2 weeks?"     n/A 10. HIGH RISK: "Do you have any heart or lung problems? Do you have a weak immune system?" (e.g., heart failure, COPD, asthma, HIV positive, chemotherapy, renal failure, diabetes mellitus, sickle cell anemia, obesity)       none 11.  TRAVEL: "Have you traveled out of the country recently?" If so, "When and where?"  Also ask about out-of-state travel, since the CDC has identified some high-risk cities for community spread in the [place].  Note: Travel becomes less relevant if there is widespread community transmission where the patient lives.       No  Protocols used: CORONAVIRUS (COVID-19) EXPOSURE-A-AH

## 2019-11-20 NOTE — Telephone Encounter (Signed)
Noted and agree with what patient was verbally told

## 2019-11-23 ENCOUNTER — Other Ambulatory Visit: Payer: Self-pay

## 2019-11-23 DIAGNOSIS — Z20822 Contact with and (suspected) exposure to covid-19: Secondary | ICD-10-CM

## 2019-11-24 LAB — NOVEL CORONAVIRUS, NAA: SARS-CoV-2, NAA: NOT DETECTED

## 2019-11-25 ENCOUNTER — Telehealth: Payer: Self-pay | Admitting: *Deleted

## 2019-11-25 NOTE — Telephone Encounter (Signed)
Patient given negative covid results ,

## 2020-09-28 ENCOUNTER — Other Ambulatory Visit: Payer: Self-pay

## 2020-09-28 ENCOUNTER — Encounter: Payer: Self-pay | Admitting: Physician Assistant

## 2020-09-28 ENCOUNTER — Ambulatory Visit (INDEPENDENT_AMBULATORY_CARE_PROVIDER_SITE_OTHER): Payer: BC Managed Care – PPO | Admitting: Physician Assistant

## 2020-09-28 ENCOUNTER — Other Ambulatory Visit (HOSPITAL_COMMUNITY)
Admission: RE | Admit: 2020-09-28 | Discharge: 2020-09-28 | Disposition: A | Discharge status: Home / Self Care | Payer: BC Managed Care – PPO | Source: Ambulatory Visit | Attending: Physician Assistant | Admitting: Physician Assistant

## 2020-09-28 VITALS — BP 137/77 | HR 63 | Temp 98.4°F | Resp 16 | Ht 63.0 in | Wt 121.0 lb

## 2020-09-28 DIAGNOSIS — Z1211 Encounter for screening for malignant neoplasm of colon: Secondary | ICD-10-CM | POA: Diagnosis not present

## 2020-09-28 DIAGNOSIS — Z124 Encounter for screening for malignant neoplasm of cervix: Secondary | ICD-10-CM | POA: Insufficient documentation

## 2020-09-28 DIAGNOSIS — E7800 Pure hypercholesterolemia, unspecified: Secondary | ICD-10-CM

## 2020-09-28 DIAGNOSIS — F419 Anxiety disorder, unspecified: Secondary | ICD-10-CM | POA: Diagnosis not present

## 2020-09-28 DIAGNOSIS — Z1231 Encounter for screening mammogram for malignant neoplasm of breast: Secondary | ICD-10-CM

## 2020-09-28 DIAGNOSIS — N941 Unspecified dyspareunia: Secondary | ICD-10-CM

## 2020-09-28 DIAGNOSIS — Z1272 Encounter for screening for malignant neoplasm of vagina: Secondary | ICD-10-CM | POA: Diagnosis not present

## 2020-09-28 DIAGNOSIS — Z0000 Encounter for general adult medical examination without abnormal findings: Secondary | ICD-10-CM | POA: Diagnosis not present

## 2020-09-28 DIAGNOSIS — F3342 Major depressive disorder, recurrent, in full remission: Secondary | ICD-10-CM | POA: Diagnosis not present

## 2020-09-28 DIAGNOSIS — Z8619 Personal history of other infectious and parasitic diseases: Secondary | ICD-10-CM

## 2020-09-28 MED ORDER — PAROXETINE HCL 40 MG PO TABS: 40.0000 mg | ORAL_TABLET | Freq: Every morning | ORAL | 3 refills | Status: DC

## 2020-09-28 MED ORDER — ESTROGENS, CONJUGATED 0.625 MG/GM VA CREA: 1.0000 | TOPICAL_CREAM | VAGINAL | 12 refills | Status: DC

## 2020-09-28 MED ORDER — VALACYCLOVIR HCL 500 MG PO TABS: 500.0000 mg | ORAL_TABLET | Freq: Every day | ORAL | 3 refills | Status: DC

## 2020-09-28 MED ORDER — ALPRAZOLAM 0.5 MG PO TABS: 0.5000 mg | ORAL_TABLET | Freq: Three times a day (TID) | ORAL | 5 refills | Status: DC | PRN

## 2020-09-28 NOTE — Progress Notes (Signed)
Complete physical exam   Patient: Madison Reese   DOB: Apr 20, 1956   64 y.o. Female  MRN: [id number] Visit Date: 09/28/2020  Today's healthcare provider: Margaretann Loveless, PA-C   Chief Complaint  Patient presents with  . Annual Exam   Subjective    Madison Reese is a 64 y.o. female who presents today for a complete physical exam.  She reports consuming a general diet. Gym/ health club routine includes cardio, light weights and walks outdoor for 2-4 miles. She generally feels well. She reports sleeping well. She does not have additional problems to discuss today.  HPI   Past Medical History:  Diagnosis Date  . Anxiety    Past Surgical History:  Procedure Laterality Date  . ABDOMINAL HYSTERECTOMY  2008   Partial- due to menorrhagia  . FOOT SURGERY Bilateral 02/2005   removal of 5th digit on bilateral feet  . TUBAL LIGATION  1995   Social History   Socioeconomic History  . Marital status: Married    Spouse name: Not on file  . Number of children: Not on file  . Years of education: Not on file  . Highest education level: Not on file  Occupational History  . Not on file  Tobacco Use  . Smoking status: Never Smoker  . Smokeless tobacco: Never Used  Vaping Use  . Vaping Use: Never used  Substance and Sexual Activity  . Alcohol use: No  . Drug use: No  . Sexual activity: Yes  Other Topics Concern  . Not on file  Social History Narrative  . Not on file   Social Determinants of Health   Financial Resource Strain:   . Difficulty of Paying Living Expenses: Not on file  Food Insecurity:   . Worried About Programme researcher, broadcasting/film/video in the Last Year: Not on file  . Ran Out of Food in the Last Year: Not on file  Transportation Needs:   . Lack of Transportation (Medical): Not on file  . Lack of Transportation (Non-Medical): Not on file  Physical Activity:   . Days of Exercise per Week: Not on file  . Minutes of Exercise per Session: Not on file  Stress:   .  Feeling of Stress : Not on file  Social Connections:   . Frequency of Communication with Friends and Family: Not on file  . Frequency of Social Gatherings with Friends and Family: Not on file  . Attends Religious Services: Not on file  . Active Member of Clubs or Organizations: Not on file  . Attends Banker Meetings: Not on file  . Marital Status: Not on file  Intimate Partner Violence:   . Fear of Current or Ex-Partner: Not on file  . Emotionally Abused: Not on file  . Physically Abused: Not on file  . Sexually Abused: Not on file   Family Status  Relation Name Status  . Mother  Deceased  . Father  Deceased at age 42  . Sister  Alive  . Brother  Alive  . Mat Uncle  Deceased  . MGM  Deceased  . Sister  Alive  . Neg Hx  (Not Specified)   Family History  Problem Relation Age of Onset  . Arthritis Mother   . Depression Mother   . Hypertension Mother   . Cancer Mother 70       Pancreatic cancer  . Heart attack Father   . Hyperlipidemia Sister   . Hypertension Brother   .  Lung cancer Maternal Uncle   . Diabetes Maternal Uncle   . Alzheimer's disease Maternal Grandmother   . Depression Sister   . Breast cancer Neg Hx    No Known Allergies  Patient Care Team: Reine Just as PCP - General (Physician Assistant)   Medications: Outpatient Medications Prior to Visit  Medication Sig  . CALCIUM CARBONATE-VIT D-MIN PO Take by mouth.  . Lactobacillus (ULTIMATE PROBIOTIC FORMULA) CAPS Take by mouth.  . Multiple Vitamin tablet Take by mouth.  . Nutritional Supplements (IMMUNE ENHANCE) TABS Take by mouth.  . Omega-3 Fatty Acids (FISH OIL) 1200 MG CAPS Take by mouth.  . [DISCONTINUED] ALPRAZolam (XANAX) 0.5 MG tablet Take 1 tablet (0.5 mg total) by mouth 3 (three) times daily as needed for anxiety.  . [DISCONTINUED] conjugated estrogens (PREMARIN) vaginal cream Place 1 Applicatorful vaginally once a week.  . [DISCONTINUED] PARoxetine (PAXIL) 40 MG  tablet Take 1 tablet (40 mg total) by mouth every morning.  . [DISCONTINUED] valACYclovir (VALTREX) 500 MG tablet Take 1 tablet (500 mg total) by mouth daily.   No facility-administered medications prior to visit.    Review of Systems  Constitutional: Negative.   HENT: Negative.   Eyes: Negative.   Respiratory: Negative.   Cardiovascular: Negative.   Gastrointestinal: Negative.   Endocrine: Negative.   Genitourinary: Negative.   Musculoskeletal: Negative.   Skin: Negative.   Allergic/Immunologic: Negative.   Neurological: Negative.   Hematological: Negative.   Psychiatric/Behavioral: Negative.     Last CBC Lab Results  Component Value Date   WBC 4.6 09/28/2020   HGB 14.0 09/28/2020   HCT 44.4 09/28/2020   MCV 88 09/28/2020   MCH 27.8 09/28/2020   RDW 13.0 09/28/2020   PLT 222 09/28/2020   Last metabolic panel Lab Results  Component Value Date   GLUCOSE 76 09/28/2020   NA 141 09/28/2020   K 4.3 09/28/2020   CL 100 09/28/2020   CO2 22 09/28/2020   BUN 22 09/28/2020   CREATININE 0.99 09/28/2020   GFRNONAA 60 09/28/2020   GFRAA 70 09/28/2020   CALCIUM 10.0 09/28/2020   PROT 7.3 09/28/2020   ALBUMIN 4.7 09/28/2020   LABGLOB 2.6 09/28/2020   AGRATIO 1.8 09/28/2020   BILITOT 0.4 09/28/2020   ALKPHOS 103 09/28/2020   AST 29 09/28/2020   ALT 17 09/28/2020      Objective    BP 137/77 (BP Location: Right Arm, Patient Position: Sitting, Cuff Size: Large)   Pulse 63   Temp 98.4 F (36.9 C) (Oral)   Resp 16   Ht 5\' 3"  (1.6 m)   Wt 121 lb (54.9 kg)   BMI 21.43 kg/m  BP Readings from Last 3 Encounters:  09/28/20 137/77  02/17/19 (!) 149/83  11/24/18 139/80   Wt Readings from Last 3 Encounters:  09/28/20 121 lb (54.9 kg)  02/17/19 117 lb 6.4 oz (53.3 kg)  11/24/18 117 lb 6.4 oz (53.3 kg)      Physical Exam Vitals reviewed.  Constitutional:      General: She is not in acute distress.    Appearance: Normal appearance. She is well-developed and normal  weight. She is not ill-appearing or diaphoretic.  HENT:     Head: Normocephalic and atraumatic.     Right Ear: Hearing, tympanic membrane, ear canal and external ear normal.     Left Ear: Hearing, tympanic membrane, ear canal and external ear normal.     Nose: Nose normal.     Mouth/Throat:  Mouth: Mucous membranes are moist.     Pharynx: Oropharynx is clear. Uvula midline. No oropharyngeal exudate or posterior oropharyngeal erythema.  Eyes:     General: No scleral icterus.       Right eye: No discharge.        Left eye: No discharge.     Extraocular Movements: Extraocular movements intact.     Conjunctiva/sclera: Conjunctivae normal.     Pupils: Pupils are equal, round, and reactive to light.  Neck:     Thyroid: No thyromegaly.     Vascular: No carotid bruit or JVD.     Trachea: No tracheal deviation.  Cardiovascular:     Rate and Rhythm: Normal rate and regular rhythm.     Pulses: Normal pulses.     Heart sounds: Normal heart sounds. No murmur heard.  No friction rub. No gallop.   Pulmonary:     Effort: Pulmonary effort is normal. No respiratory distress.     Breath sounds: Normal breath sounds. No wheezing or rales.  Chest:     Chest wall: No tenderness.     Breasts: Breasts are symmetrical.        Right: No inverted nipple, mass, nipple discharge, skin change or tenderness.        Left: No inverted nipple, mass, nipple discharge, skin change or tenderness.  Abdominal:     General: Abdomen is flat. Bowel sounds are normal. There is no distension.     Palpations: Abdomen is soft. There is no mass.     Tenderness: There is no abdominal tenderness. There is no guarding or rebound.     Hernia: There is no hernia in the left inguinal area.  Genitourinary:    General: Normal vulva.     Exam position: Supine.     Labia:        Right: No rash, tenderness, lesion or injury.        Left: No rash, tenderness, lesion or injury.      Vagina: Normal. No signs of injury. No vaginal  discharge, erythema, tenderness or bleeding.     Cervix: No cervical motion tenderness, discharge or friability.     Adnexa:        Right: No mass, tenderness or fullness.         Left: No mass, tenderness or fullness.       Rectum: Normal.  Musculoskeletal:        General: No tenderness. Normal range of motion.     Cervical back: Normal range of motion and neck supple. No tenderness.     Right lower leg: No edema.     Left lower leg: No edema.  Lymphadenopathy:     Cervical: No cervical adenopathy.  Skin:    General: Skin is warm and dry.     Capillary Refill: Capillary refill takes less than 2 seconds.     Findings: No rash.  Neurological:     General: No focal deficit present.     Mental Status: She is alert and oriented to person, place, and time. Mental status is at baseline.     Cranial Nerves: No cranial nerve deficit.     Coordination: Coordination normal.     Deep Tendon Reflexes: Reflexes are normal and symmetric.  Psychiatric:        Mood and Affect: Mood normal.        Behavior: Behavior normal.        Thought Content: Thought content normal.  Judgment: Judgment normal.     Last depression screening scores PHQ 2/9 Scores 09/28/2020 11/24/2018 06/03/2015  PHQ - 2 Score 0 0 0  PHQ- 9 Score 0 0 -   Last fall risk screening Fall Risk  09/28/2020  Falls in the past year? 0  Number falls in past yr: 0  Injury with Fall? 0  Risk for fall due to : No Fall Risks  Follow up Falls evaluation completed   Last Audit-C alcohol use screening Alcohol Use Disorder Test (AUDIT) 09/28/2020  1. How often do you have a drink containing alcohol? 0  2. How many drinks containing alcohol do you have on a typical day when you are drinking? 0  3. How often do you have six or more drinks on one occasion? 0  AUDIT-C Score 0  Alcohol Brief Interventions/Follow-up AUDIT Score <7 follow-up not indicated   A score of 3 or more in women, and 4 or more in men indicates increased  risk for alcohol abuse, EXCEPT if all of the points are from question 1   Results for orders placed or performed in visit on 09/28/20  CBC with Differential/Platelet  Result Value Ref Range   WBC 4.6 3.4 - 10.8 x10E3/uL   RBC 5.04 3.77 - 5.28 x10E6/uL   Hemoglobin 14.0 11.1 - 15.9 g/dL   Hematocrit 34.1 93.7 - 46.6 %   MCV 88 79 - 97 fL   MCH 27.8 26.6 - 33.0 pg   MCHC 31.5 31 - 35 g/dL   RDW 90.2 40.9 - 73.5 %   Platelets 222 150 - 450 x10E3/uL   Neutrophils 59 Not Estab. %   Lymphs 30 Not Estab. %   Monocytes 7 Not Estab. %   Eos 3 Not Estab. %   Basos 1 Not Estab. %   Neutrophils Absolute 2.7 1.40 - 7.00 x10E3/uL   Lymphocytes Absolute 1.4 0 - 3 x10E3/uL   Monocytes Absolute 0.3 0 - 0 x10E3/uL   EOS (ABSOLUTE) 0.2 0.0 - 0.4 x10E3/uL   Basophils Absolute 0.1 0 - 0 x10E3/uL   Immature Granulocytes 0 Not Estab. %   Immature Grans (Abs) 0.0 0.0 - 0.1 x10E3/uL  Comprehensive metabolic panel  Result Value Ref Range   Glucose 76 65 - 99 mg/dL   BUN 22 8 - 27 mg/dL   Creatinine, Ser 3.29 0.57 - 1.00 mg/dL   GFR calc non Af Amer 60 >59 mL/min/1.73   GFR calc Af Amer 70 >59 mL/min/1.73   BUN/Creatinine Ratio 22 12 - 28   Sodium 141 134 - 144 mmol/L   Potassium 4.3 3.5 - 5.2 mmol/L   Chloride 100 96 - 106 mmol/L   CO2 22 20 - 29 mmol/L   Calcium 10.0 8.7 - 10.3 mg/dL   Total Protein 7.3 6.0 - 8.5 g/dL   Albumin 4.7 3.8 - 4.8 g/dL   Globulin, Total 2.6 1.5 - 4.5 g/dL   Albumin/Globulin Ratio 1.8 1.2 - 2.2   Bilirubin Total 0.4 0.0 - 1.2 mg/dL   Alkaline Phosphatase 103 44 - 121 IU/L   AST 29 0 - 40 IU/L   ALT 17 0 - 32 IU/L  Hemoglobin A1c  Result Value Ref Range   Hgb A1c MFr Bld 5.7 (H) 4.8 - 5.6 %   Est. average glucose Bld gHb Est-mCnc 117 mg/dL  Lipid panel  Result Value Ref Range   Cholesterol, Total 227 (H) 100 - 199 mg/dL   Triglycerides 38 0 - 149 mg/dL  HDL 108 >39 mg/dL   VLDL Cholesterol Cal 7 5 - 40 mg/dL   LDL Chol Calc (NIH) 161112 (H) 0 - 99 mg/dL    Chol/HDL Ratio 2.1 0.0 - 4.4 ratio  TSH  Result Value Ref Range   TSH 2.610 0.450 - 4.500 uIU/mL    Assessment & Plan    Routine Health Maintenance and Physical Exam  Exercise Activities and Dietary recommendations Goals   None     Immunization History  Administered Date(s) Administered  . Td 11/24/2018  . Tdap 10/01/2007    Health Maintenance  Topic Date Due  . COVID-19 Vaccine (1) Never done  . COLONOSCOPY  11/25/2017  . PAP SMEAR-Modifier  11/27/2019  . INFLUENZA VACCINE  Never done  . MAMMOGRAM  10/30/2020  . TETANUS/TDAP  11/24/2028  . Hepatitis C Screening  Completed  . HIV Screening  Completed    Discussed health benefits of physical activity, and encouraged her to engage in regular exercise appropriate for her age and condition.  1. Annual physical exam Normal physical exam today. Will check labs as below and f/u pending lab results. If labs are stable and WNL she will not need to have these rechecked for one year at her next annual physical exam. She is to call the office in the meantime if she has any acute issue, questions or concerns. - CBC with Differential/Platelet - Comprehensive metabolic panel - Hemoglobin A1c - Lipid panel - TSH  2. Screening mammogram for breast cancer Breast exam today was normal. There is no family history of breast cancer. She does perform regular self breast exams. Mammogram was ordered as below. Information [facility] was given to patient so she may schedule her mammogram at her convenience. - MM 3D SCREEN BREAST BILATERAL  3. Colon cancer screening Overdue for colon cancer screening. No family history. No history of polyps. Will order Cologuard as below. Will f/u pending results.  - Cologuard  4. Screening for vaginal cancer Pap collected today. Will send as below and f/u pending results. - Cytology - PAP  5. Hypercholesteremia Diet controlled. Will check labs as below and f/u pending results. - CBC  with Differential/Platelet - Comprehensive metabolic panel - Hemoglobin A1c - Lipid panel  6. Recurrent major depressive disorder, in full remission (HCC) Stable. Diagnosis pulled for medication refill. Continue current medical treatment plan. Will check labs as below and f/u pending results. - TSH - PARoxetine (PAXIL) 40 MG tablet; Take 1 tablet (40 mg total) by mouth every morning.  Dispense: 90 tablet; Refill: 3  7. Anxiety Stable. Diagnosis pulled for medication refill. Continue current medical treatment plan. Will check labs as below and f/u pending results. - TSH - ALPRAZolam (XANAX) 0.5 MG tablet; Take 1 tablet (0.5 mg total) by mouth 3 (three) times daily as needed for anxiety.  Dispense: 90 tablet; Refill: 5  8. Dyspareunia in female Stable. Diagnosis pulled for medication refill. Continue current medical treatment plan. - conjugated estrogens (PREMARIN) vaginal cream; Place 1 Applicatorful vaginally once a week.  Dispense: 42.5 g; Refill: 12  9. H/O cold sores Stable. Diagnosis pulled for medication refill. Continue current medical treatment plan. - valACYclovir (VALTREX) 500 MG tablet; Take 1 tablet (500 mg total) by mouth daily.  Dispense: 90 tablet; Refill: 3   Return in about 1 year (around 09/28/2021) for CPE.     Delmer IslamI, Bricyn Labrada M Dresden Lozito, PA-C, have reviewed all documentation for this visit. The documentation on 09/29/20 for the exam, diagnosis, procedures,  and orders are all accurate and complete.   Reine Just  [facility] [phone] (phone) [phone] (fax)  [facility]

## 2020-09-28 NOTE — Patient Instructions (Signed)
[facility] [address] [place],  [place]  [address] Main: [phone]   Preventive Care 40-64 Years Old, Female Preventive care refers to visits with your health care provider and lifestyle choices that can promote health and wellness. This includes:  A yearly physical exam. This may also be called an annual well check.  Regular dental visits and eye exams.  Immunizations.  Screening for certain conditions.  Healthy lifestyle choices, such as eating a healthy diet, getting regular exercise, not using drugs or products that contain nicotine and tobacco, and limiting alcohol use. What can I expect for my preventive care visit? Physical exam Your health care provider will check your:  Height and weight. This may be used to calculate body mass index (BMI), which tells if you are at a healthy weight.  Heart rate and blood pressure.  Skin for abnormal spots. Counseling Your health care provider may ask you questions about your:  Alcohol, tobacco, and drug use.  Emotional well-being.  Home and relationship well-being.  Sexual activity.  Eating habits.  Work and work environment.  Method of birth control.  Menstrual cycle.  Pregnancy history. What immunizations do I need?  Influenza (flu) vaccine  This is recommended every year. Tetanus, diphtheria, and pertussis (Tdap) vaccine  You may need a Td booster every 10 years. Varicella (chickenpox) vaccine  You may need this if you have not been vaccinated. Zoster (shingles) vaccine  You may need this after age 60. Measles, mumps, and rubella (MMR) vaccine  You may need at least one dose of MMR if you were born in 1957 or later. You may also need a second dose. Pneumococcal conjugate (PCV13) vaccine  You may need this if you have certain conditions and were not previously vaccinated. Pneumococcal polysaccharide (PPSV23) vaccine  You may need one or two doses if you smoke  cigarettes or if you have certain conditions. Meningococcal conjugate (MenACWY) vaccine  You may need this if you have certain conditions. Hepatitis A vaccine  You may need this if you have certain conditions or if you travel or work in places where you may be exposed to hepatitis A. Hepatitis B vaccine  You may need this if you have certain conditions or if you travel or work in places where you may be exposed to hepatitis B. Haemophilus influenzae type b (Hib) vaccine  You may need this if you have certain conditions. Human papillomavirus (HPV) vaccine  If recommended by your health care provider, you may need three doses over 6 months. You may receive vaccines as individual doses or as more than one vaccine together in one shot (combination vaccines). Talk with your health care provider about the risks and benefits of combination vaccines. What tests do I need? Blood tests  Lipid and cholesterol levels. These may be checked every 5 years, or more frequently if you are over 50 years old.  Hepatitis C test.  Hepatitis B test. Screening  Lung cancer screening. You may have this screening every year starting at age 55 if you have a 30-pack-year history of smoking and currently smoke or have quit within the past 15 years.  Colorectal cancer screening. All adults should have this screening starting at age 50 and continuing until age 75. Your health care provider may recommend screening at age 45 if you are at increased risk. You will have tests every 1-10 years, depending on your results and the type of screening test.  Diabetes screening. This is   done by checking your blood sugar (glucose) after you have not eaten for a while (fasting). You may have this done every 1-3 years.  Mammogram. This may be done every 1-2 years. Talk with your health care provider about when you should start having regular mammograms. This may depend on whether you have a family history of breast  cancer.  BRCA-related cancer screening. This may be done if you have a family history of breast, ovarian, tubal, or peritoneal cancers.  Pelvic exam and Pap test. This may be done every 3 years starting at age 9. Starting at age 22, this may be done every 5 years if you have a Pap test in combination with an HPV test. Other tests  Sexually transmitted disease (STD) testing.  Bone density scan. This is done to screen for osteoporosis. You may have this scan if you are at high risk for osteoporosis. Follow these instructions at home: Eating and drinking  Eat a diet that includes fresh fruits and vegetables, whole grains, lean protein, and low-fat dairy.  Take vitamin and mineral supplements as recommended by your health care provider.  Do not drink alcohol if: ? Your health care provider tells you not to drink. ? You are pregnant, may be pregnant, or are planning to become pregnant.  If you drink alcohol: ? Limit how much you have to 0-1 drink a day. ? Be aware of how much alcohol is in your drink. In the [place] one drink equals one 12 oz bottle of beer (355 mL), one 5 oz glass of wine (148 mL), or one 1 oz glass of hard liquor (44 mL). Lifestyle  Take daily care of your teeth and gums.  Stay active. Exercise for at least 30 minutes on 5 or more days each week.  Do not use any products that contain nicotine or tobacco, such as cigarettes, e-cigarettes, and chewing tobacco. If you need help quitting, ask your health care provider.  If you are sexually active, practice safe sex. Use a condom or other form of birth control (contraception) in order to prevent pregnancy and STIs (sexually transmitted infections).  If told by your health care provider, take low-dose aspirin daily starting at age 18. What's next?  Visit your health care provider once a year for a well check visit.  Ask your health care provider how often you should have your eyes and teeth checked.  Stay up to date  on all vaccines. This information is not intended to replace advice given to you by your health care provider. Make sure you discuss any questions you have with your health care provider. Document Revised: 08/07/2018 Document Reviewed: 08/07/2018 Elsevier Patient Education  2020 [employer].

## 2020-09-29 ENCOUNTER — Telehealth: Payer: Self-pay

## 2020-09-29 LAB — CBC WITH DIFFERENTIAL/PLATELET
Basophils Absolute: 0.1 10*3/uL (ref 0.0–0.2)
Basos: 1 %
EOS (ABSOLUTE): 0.2 10*3/uL (ref 0.0–0.4)
Eos: 3 %
Hematocrit: 44.4 % (ref 34.0–46.6)
Hemoglobin: 14 g/dL (ref 11.1–15.9)
Immature Grans (Abs): 0 10*3/uL (ref 0.0–0.1)
Immature Granulocytes: 0 %
Lymphocytes Absolute: 1.4 10*3/uL (ref 0.7–3.1)
Lymphs: 30 %
MCH: 27.8 pg (ref 26.6–33.0)
MCHC: 31.5 g/dL (ref 31.5–35.7)
MCV: 88 fL (ref 79–97)
Monocytes Absolute: 0.3 10*3/uL (ref 0.1–0.9)
Monocytes: 7 %
Neutrophils Absolute: 2.7 10*3/uL (ref 1.4–7.0)
Neutrophils: 59 %
Platelets: 222 10*3/uL (ref 150–450)
RBC: 5.04 x10E6/uL (ref 3.77–5.28)
RDW: 13 % (ref 11.7–15.4)
WBC: 4.6 10*3/uL (ref 3.4–10.8)

## 2020-09-29 LAB — COMPREHENSIVE METABOLIC PANEL
ALT: 17 IU/L (ref 0–32)
AST: 29 IU/L (ref 0–40)
Albumin/Globulin Ratio: 1.8 (ref 1.2–2.2)
Albumin: 4.7 g/dL (ref 3.8–4.8)
Alkaline Phosphatase: 103 IU/L (ref 44–121)
BUN/Creatinine Ratio: 22 (ref 12–28)
BUN: 22 mg/dL (ref 8–27)
Bilirubin Total: 0.4 mg/dL (ref 0.0–1.2)
CO2: 22 mmol/L (ref 20–29)
Calcium: 10 mg/dL (ref 8.7–10.3)
Chloride: 100 mmol/L (ref 96–106)
Creatinine, Ser: 0.99 mg/dL (ref 0.57–1.00)
GFR calc Af Amer: 70 mL/min/{1.73_m2} (ref >59)
GFR calc non Af Amer: 60 mL/min/{1.73_m2} (ref >59)
Globulin, Total: 2.6 g/dL (ref 1.5–4.5)
Glucose: 76 mg/dL (ref 65–99)
Potassium: 4.3 mmol/L (ref 3.5–5.2)
Sodium: 141 mmol/L (ref 134–144)
Total Protein: 7.3 g/dL (ref 6.0–8.5)

## 2020-09-29 LAB — HEMOGLOBIN A1C
Est. average glucose Bld gHb Est-mCnc: 117 mg/dL
Hgb A1c MFr Bld: 5.7 % — ABNORMAL HIGH (ref 4.8–5.6)

## 2020-09-29 LAB — LIPID PANEL
Chol/HDL Ratio: 2.1 ratio (ref 0.0–4.4)
Cholesterol, Total: 227 mg/dL — ABNORMAL HIGH (ref 100–199)
HDL: 108 mg/dL (ref >39)
LDL Chol Calc (NIH): 112 mg/dL — ABNORMAL HIGH (ref 0–99)
Triglycerides: 38 mg/dL (ref 0–149)
VLDL Cholesterol Cal: 7 mg/dL (ref 5–40)

## 2020-09-29 LAB — TSH: TSH: 2.61 u[IU]/mL (ref 0.450–4.500)

## 2020-09-29 NOTE — Telephone Encounter (Signed)
-----   Message from Margaretann Loveless, [place] sent at 09/29/2020 12:29 PM EDT ----- Blood count is normal. Kidney and liver function are normal. Sodium, potassium, and calcium are normal. A1c/sugar are stable. A1c is borderline elevated, but unchanged from last year at 5.7. Cholesterol is also stable. Still borderline elevated, but no need for cholesterol lowering medication at this time. HDL (good) cholesterol is elevated and offers cardioprotection. Thyroid is normal.

## 2020-09-29 NOTE — Telephone Encounter (Signed)
LMTCB 09/29/2020.  PEC Please advise pt of lab results when she calls back.   Thanks,   -Vernona Rieger

## 2020-09-30 LAB — CYTOLOGY - PAP
Comment: NEGATIVE
Diagnosis: NEGATIVE
High risk HPV: NEGATIVE

## 2020-11-07 ENCOUNTER — Telehealth: Payer: Self-pay

## 2020-11-07 NOTE — Telephone Encounter (Signed)
Copied from CRM [id number]. Topic: General - Other >> Nov 07, 2020  4:56 PM Dalphine Handing A wrote: Patient needs documentation of her mammogram in march 2018 to provide to [employer]. Patient would like to pick this up on Thursday and would like a callback once its complete  Printed mammogram results from 2018 and placed up front for pick up.

## 2020-11-24 DIAGNOSIS — M7542 Impingement syndrome of left shoulder: Secondary | ICD-10-CM | POA: Diagnosis not present

## 2020-11-24 DIAGNOSIS — M7541 Impingement syndrome of right shoulder: Secondary | ICD-10-CM | POA: Diagnosis not present

## 2020-12-28 ENCOUNTER — Telehealth: Payer: Self-pay

## 2020-12-28 NOTE — Telephone Encounter (Signed)
Copied from CRM [id number]. Topic: General - Other >> Dec 28, 2020  9:36 AM Gaetana Michaelis A wrote: Reason for CRM: Patient woke up experiencing soreness on the left side of her throat. With very little discomfort.  Patient is experiencing no other symptoms or issues at the current time. Patient would like to know if she should be tested and also requires a note from her PCP to go back to work.

## 2020-12-29 ENCOUNTER — Telehealth: Payer: Self-pay

## 2020-12-29 ENCOUNTER — Telehealth (INDEPENDENT_AMBULATORY_CARE_PROVIDER_SITE_OTHER): Payer: BC Managed Care – PPO | Admitting: Physician Assistant

## 2020-12-29 DIAGNOSIS — J029 Acute pharyngitis, unspecified: Secondary | ICD-10-CM

## 2020-12-29 DIAGNOSIS — U071 COVID-19: Secondary | ICD-10-CM

## 2020-12-29 MED ORDER — PREDNISONE 20 MG PO TABS: 20.0000 mg | ORAL_TABLET | Freq: Every day | ORAL | 0 refills | Status: AC

## 2020-12-29 NOTE — Telephone Encounter (Signed)
She could be tested today at 3pm. Releasing her back to work would depend on what her work allows. If she is still not having any other symptoms and she is vaccinated, she could work, masked, until test results come back. Most employers make people await their results, however.

## 2020-12-29 NOTE — Telephone Encounter (Signed)
Appt scheduled

## 2020-12-29 NOTE — Progress Notes (Signed)
MyChart Video Visit    Virtual Visit via Video Note   This visit type was conducted due to national recommendations for restrictions regarding the COVID-19 Pandemic (e.g. social distancing) in an effort to limit this patient's exposure and mitigate transmission in our community. This patient is at least at moderate risk for complications without adequate follow up. This format is felt to be most appropriate for this patient at this time. Physical exam was limited by quality of the video and audio technology used for the visit.   Patient location: Home Provider location: Office   I discussed the limitations of evaluation and management by telemedicine and the availability of in person appointments. The patient expressed understanding and agreed to proceed.  Patient: Madison Reese   DOB: 1956-05-26   65 y.o. Female  MRN: [id number] Visit Date: 12/29/2020  Today's healthcare provider: Trey Sailors, PA-C   Chief Complaint  Patient presents with  . Covid Positive   Subjective    HPI   Patient presents today with sore throat x 1 day. She had a home covid test last night which was positive. She would like to get a PCR test. She has been vaccinated against COVID. Second covid dose was mid 2021.     Patient Active Problem List   Diagnosis Date Noted  . 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 Stress incontinence in female 06/03/2015  . Anxiety 04/11/2015  . 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 Hypercholesteremia 04/11/2015  . Pectus excavatum 04/11/2015  . Restless legs syndrome 04/11/2015  . Avitaminosis D 04/11/2015   Past Medical History:  Diagnosis Date  . Anxiety    Social History   Tobacco Use  . Smoking status: Never Smoker  . Smokeless tobacco: Never Used  Vaping Use  . Vaping Use: Never used  Substance Use Topics  . Alcohol use: No  . Drug use: No   No Known Allergies  Medications: Outpatient Medications Prior to  Visit  Medication Sig  . ALPRAZolam (XANAX) 0.5 MG tablet Take 1 tablet (0.5 mg total) by mouth 3 (three) times daily as needed for anxiety.  Marland Kitchen CALCIUM CARBONATE-VIT D-MIN PO Take by mouth.  . conjugated estrogens (PREMARIN) vaginal cream Place 1 Applicatorful vaginally once a week.  . Lactobacillus (ULTIMATE PROBIOTIC FORMULA) CAPS Take by mouth.  . Multiple Vitamin tablet Take by mouth.  . Nutritional Supplements (IMMUNE ENHANCE) TABS Take by mouth.  . Omega-3 Fatty Acids (FISH OIL) 1200 MG CAPS Take by mouth.  Marland Kitchen PARoxetine (PAXIL) 40 MG tablet Take 1 tablet (40 mg total) by mouth every morning.  . valACYclovir (VALTREX) 500 MG tablet Take 1 tablet (500 mg total) by mouth daily.   No facility-administered medications prior to visit.    Review of Systems  Constitutional: Positive for fatigue. Negative for activity change, appetite change, chills, diaphoresis, fever and unexpected weight change.  HENT: Positive for sore throat. Negative for congestion, dental problem, drooling, ear discharge, ear pain, facial swelling, hearing loss, mouth sores, nosebleeds, postnasal drip, rhinorrhea, sinus pressure, sinus pain, sneezing, tinnitus, trouble swallowing and voice change.   Respiratory: Negative.   Cardiovascular: Negative.   Gastrointestinal: Negative.   Neurological: Negative for dizziness, light-headedness and headaches.      Objective    There were no vitals taken for this visit.   Physical Exam Constitutional:      Appearance: Normal appearance.  Pulmonary:     Effort: Pulmonary effort is normal. No respiratory distress.  Neurological:     Mental Status: She is alert.  Psychiatric:        Mood and Affect: Mood normal.        Behavior: Behavior normal.        Assessment & Plan    1. Sore throat  - COVID-19, Flu A+B and RSV - predniSONE (DELTASONE) 20 MG tablet; Take 1 tablet (20 mg total) by mouth daily with breakfast for 5 days.  Dispense: 5 tablet; Refill: 0  2.  COVID-19  - COVID-19, Flu A+B and RSV - Ambulatory referral for Covid Treatment - predniSONE (DELTASONE) 20 MG tablet; Take 1 tablet (20 mg total) by mouth daily with breakfast for 5 days.  Dispense: 5 tablet; Refill: 0   Return if symptoms worsen or fail to improve.     I discussed the assessment and treatment plan with the patient. The patient was provided an opportunity to ask questions and all were answered. The patient agreed with the plan and demonstrated an understanding of the instructions.   The patient was advised to call back or seek an in-person evaluation if the symptoms worsen or if the condition fails to improve as anticipated.   ITrey Sailors, PA-C, have reviewed all documentation for this visit. The documentation on 12/29/20 for the exam, diagnosis, procedures, and orders are all accurate and complete.  The entirety of the information documented in the History of Present Illness, Review of Systems and Physical Exam were personally obtained by me. Portions of this information were initially documented by Anson Oregon, CMA and reviewed by me for thoroughness and accuracy.    Maryella Shivers [facility] [phone] (phone) [phone] (fax)  [facility]

## 2020-12-29 NOTE — Telephone Encounter (Signed)
Virtual visit today a 2pm with Adriana.  Thanks,   -Vernona Rieger

## 2020-12-29 NOTE — Telephone Encounter (Signed)
Copied from CRM [id number]. Topic: General - Other >> Dec 28, 2020  9:36 AM Gaetana Michaelis A wrote: Reason for CRM: Patient woke up experiencing soreness on the left side of her throat. With very little discomfort.  Patient is experiencing no other symptoms or issues at the current time. Patient would like to know if she should be tested and also requires a note from her PCP to go back to work. >> Dec 29, 2020  8:23 AM Tamela Oddi wrote: Patient would like the nurse to call her after taking a home covid test that was positive.  She stated she had called yesterday and no one returned her call.  She has some questions regarding when she could return to work and would like to get a letter from her PCP.  CB# [phone]

## 2020-12-31 LAB — COVID-19, FLU A+B AND RSV
Influenza A, NAA: NOT DETECTED
Influenza B, NAA: NOT DETECTED
RSV, NAA: NOT DETECTED
SARS-CoV-2, NAA: DETECTED — AB

## 2021-01-09 DIAGNOSIS — D225 Melanocytic nevi of trunk: Secondary | ICD-10-CM | POA: Diagnosis not present

## 2021-01-09 DIAGNOSIS — D2262 Melanocytic nevi of left upper limb, including shoulder: Secondary | ICD-10-CM | POA: Diagnosis not present

## 2021-01-09 DIAGNOSIS — D2261 Melanocytic nevi of right upper limb, including shoulder: Secondary | ICD-10-CM | POA: Diagnosis not present

## 2021-01-09 DIAGNOSIS — D2272 Melanocytic nevi of left lower limb, including hip: Secondary | ICD-10-CM | POA: Diagnosis not present

## 2021-01-12 ENCOUNTER — Other Ambulatory Visit: Payer: Self-pay

## 2021-01-12 ENCOUNTER — Ambulatory Visit
Admission: RE | Admit: 2021-01-12 | Discharge: 2021-01-12 | Disposition: A | Discharge status: Home / Self Care | Payer: BC Managed Care – PPO | Source: Ambulatory Visit | Attending: Physician Assistant | Admitting: Physician Assistant

## 2021-01-12 DIAGNOSIS — Z1231 Encounter for screening mammogram for malignant neoplasm of breast: Secondary | ICD-10-CM | POA: Diagnosis not present

## 2021-05-23 ENCOUNTER — Ambulatory Visit: Payer: BC Managed Care – PPO | Admitting: Dermatology

## 2021-08-17 ENCOUNTER — Telehealth: Payer: Self-pay

## 2021-08-17 ENCOUNTER — Other Ambulatory Visit: Payer: Self-pay | Admitting: Family Medicine

## 2021-08-17 DIAGNOSIS — N941 Unspecified dyspareunia: Secondary | ICD-10-CM

## 2021-08-17 NOTE — Telephone Encounter (Signed)
Copied from CRM [id number]. Topic: Quick Communication - Rx Refill/Question >> Aug 17, 2021  1:38 PM Jaquita Rector A wrote: Medication: conjugated estrogens (PREMARIN) vaginal cream   Has the patient contacted their pharmacy? Yes.   (Agent: If no, request that the patient contact the pharmacy for the refill.) (Agent: If yes, when and what did the pharmacy advise?)  Preferred Pharmacy (with phone number or street name): [facility] [address] [place]), [place] - [address]  Phone:  [phone] Fax:  [phone]     Agent: Please be advised that RX refills may take up to 3 business days. We ask that you follow-up with your pharmacy.

## 2021-08-17 NOTE — Telephone Encounter (Signed)
Copied from CRM [id number]. Topic: General - Other >> Aug 17, 2021  1:42 PM Jaquita Rector A wrote: Reason for CRM: Patient scheduled to see Dr B in December but asking if there is something available with Merita Norton this month she would appreciate a call please advise

## 2021-08-18 ENCOUNTER — Ambulatory Visit: Payer: BC Managed Care – PPO | Admitting: Family Medicine

## 2021-08-18 ENCOUNTER — Other Ambulatory Visit: Payer: Self-pay

## 2021-08-18 ENCOUNTER — Encounter: Payer: Self-pay | Admitting: Family Medicine

## 2021-08-18 VITALS — BP 136/85 | HR 66 | Temp 98.2°F | Resp 16 | Wt 120.2 lb

## 2021-08-18 DIAGNOSIS — F419 Anxiety disorder, unspecified: Secondary | ICD-10-CM

## 2021-08-18 DIAGNOSIS — N951 Menopausal and female climacteric states: Secondary | ICD-10-CM | POA: Diagnosis not present

## 2021-08-18 DIAGNOSIS — M779 Enthesopathy, unspecified: Secondary | ICD-10-CM | POA: Diagnosis not present

## 2021-08-18 DIAGNOSIS — F3342 Major depressive disorder, recurrent, in full remission: Secondary | ICD-10-CM | POA: Diagnosis not present

## 2021-08-18 MED ORDER — ESTRADIOL 0.1 MG/GM VA CREA: 1.0000 | TOPICAL_CREAM | Freq: Every day | VAGINAL | 12 refills | Status: DC

## 2021-08-18 MED ORDER — ESTRING 2 MG VA RING: 2.0000 mg | VAGINAL_RING | VAGINAL | 12 refills | Status: DC

## 2021-08-18 MED ORDER — MELOXICAM 15 MG PO TABS: 15.0000 mg | ORAL_TABLET | Freq: Every day | ORAL | 0 refills | Status: DC

## 2021-08-18 MED ORDER — PAROXETINE HCL 20 MG PO TABS: 20.0000 mg | ORAL_TABLET | Freq: Every day | ORAL | 3 refills | Status: DC

## 2021-08-18 MED ORDER — ALPRAZOLAM 0.5 MG PO TABS: 0.5000 mg | ORAL_TABLET | Freq: Three times a day (TID) | ORAL | 0 refills | Status: DC | PRN

## 2021-08-18 NOTE — Assessment & Plan Note (Signed)
Ongoing concern Recommend cream Trial of ring- may need PA, pt made aware Discussed previous note from CNM who recommended water based lubricant- pt not using water based lube at this time reiterated safe, mutually agreed upon sexual relations and focus on foreplay to ensure pleasure for both partners

## 2021-08-18 NOTE — Assessment & Plan Note (Signed)
Bilateral upper arms R worse than L Recommend mobic- and limit mechanisms that strain muscles/tendon Pt denied need for work note Plan to refer to pt for exercises if no improvement

## 2021-08-18 NOTE — Progress Notes (Signed)
Established patient visit   Patient: Madison Reese   DOB: Aug 23, 1956   66 y.o. Female  MRN: [id number] Visit Date: 08/18/2021  Today's healthcare provider: Jacky Kindle, FNP   Chief Complaint  Patient presents with   Follow-up   Arm Pain    Patient reports that she has tendonitis in her right arm and states that she has difficulty raising arm, she states that she has more pain in right arm than left. Patient states that she has had evaluation at [facility] and had cortisone injections, she sates that she ist taking otc Ibuprofen now with slight relief.    Subjective    HPI HPI     Arm Pain    Additional comments: Patient reports that she has tendonitis in her right arm and states that she has difficulty raising arm, she states that she has more pain in right arm than left. Patient states that she has had evaluation at [facility] and had cortisone injections, she sates that she ist taking otc Ibuprofen now with slight relief.       Last edited by Fonda Kinder, CMA on 08/18/2021  1:56 PM.      Follow up for Dyspareunia  The patient was last seen for this 11 months ago. Changes made at last visit include none, continue Premarin .  She reports good compliance with treatment. She feels that condition is Unchanged. She is not having side effects.   -----------------------------------------------------------------------------------------     Medications: Outpatient Medications Prior to Visit  Medication Sig   CALCIUM CARBONATE-VIT D-MIN PO Take by mouth.   conjugated estrogens (PREMARIN) vaginal cream Place 1 Applicatorful vaginally once a week.   Lactobacillus (ULTIMATE PROBIOTIC FORMULA) CAPS Take by mouth.   Multiple Vitamin tablet Take by mouth.   Nutritional Supplements (IMMUNE ENHANCE) TABS Take by mouth.   Omega-3 Fatty Acids (FISH OIL) 1200 MG CAPS Take by mouth.   valACYclovir (VALTREX) 500 MG tablet Take 1 tablet (500 mg total) by mouth daily.    [DISCONTINUED] ALPRAZolam (XANAX) 0.5 MG tablet Take 1 tablet (0.5 mg total) by mouth 3 (three) times daily as needed for anxiety.   [DISCONTINUED] PARoxetine (PAXIL) 40 MG tablet Take 1 tablet (40 mg total) by mouth every morning.   No facility-administered medications prior to visit.    Review of Systems     Objective    BP 136/85   Pulse 66   Temp 98.2 F (36.8 C) (Oral)   Resp 16   Wt 120 lb 3.2 oz (54.5 kg)   SpO2 97%   BMI 21.29 kg/m    Physical Exam Vitals and nursing note reviewed.  Constitutional:      General: She is not in acute distress.    Appearance: Normal appearance. She is normal weight. She is not ill-appearing, toxic-appearing or diaphoretic.  HENT:     Head: Normocephalic and atraumatic.  Cardiovascular:     Rate and Rhythm: Normal rate and regular rhythm.     Pulses: Normal pulses.     Heart sounds: Normal heart sounds. No murmur heard.   No friction rub. No gallop.  Pulmonary:     Effort: Pulmonary effort is normal. No respiratory distress.     Breath sounds: Normal breath sounds. No stridor. No wheezing, rhonchi or rales.  Chest:     Chest wall: No tenderness.  Abdominal:     General: Bowel sounds are normal.     Palpations: Abdomen is soft.  Musculoskeletal:        General: No swelling, tenderness, deformity or signs of injury. Normal range of motion.     Cervical back: Normal range of motion.     Right lower leg: No edema.     Left lower leg: No edema.  Skin:    General: Skin is warm and dry.     Capillary Refill: Capillary refill takes less than 2 seconds.     Coloration: Skin is not jaundiced or pale.     Findings: No bruising, erythema, lesion or rash.  Neurological:     General: No focal deficit present.     Mental Status: She is alert and oriented to person, place, and time. Mental status is at baseline.     Cranial Nerves: No cranial nerve deficit.     Sensory: No sensory deficit.     Motor: No weakness.     Coordination:  Coordination normal.  Psychiatric:        Mood and Affect: Mood normal.        Behavior: Behavior normal.        Thought Content: Thought content normal.        Judgment: Judgment normal.     No results found for any visits on 08/18/21.  Assessment & Plan     Problem List Items Addressed This Visit       Musculoskeletal and Integument   Tendonitis    Bilateral upper arms R worse than L Recommend mobic- and limit mechanisms that strain muscles/tendon Pt denied need for work note Plan to refer to pt for exercises if no improvement      Relevant Medications   meloxicam (MOBIC) 15 MG tablet     Genitourinary   Vaginal dryness, menopausal - Primary    Ongoing concern Recommend cream Trial of ring- may need PA, pt made aware Discussed previous note from CNM who recommended water based lubricant- pt not using water based lube at this time reiterated safe, mutually agreed upon sexual relations and focus on foreplay to ensure pleasure for both partners      Relevant Medications   estradiol (ESTRING) 2 MG vaginal ring   estradiol (ESTRACE VAGINAL) 0.1 MG/GM vaginal cream     Other   Anxiety    Improved; has been taking medication qo day Lower dose Continue to take Can talk about weaning after proper qday dosing  Xanax refilled; database reviewed      Relevant Medications   PARoxetine (PAXIL) 20 MG tablet   ALPRAZolam (XANAX) 0.5 MG tablet   Other Relevant Orders   CBC with Differential/Platelet   Comprehensive metabolic panel   Hemoglobin A1c   VITAMIN D 25 Hydroxy (Vit-D Deficiency, Fractures)   Vitamin B12   T4, free   TSH   Magnesium   Lipid panel     Return in about 6 months (around 02/15/2022) for anxiety and depression.      Leilani Merl, FNP, have reviewed all documentation for this visit. The documentation on 08/18/21 for the exam, diagnosis, procedures, and orders are all accurate and complete.    Jacky Kindle, FNP  [facility] [phone] (phone) [phone] (fax)  [facility]

## 2021-08-18 NOTE — Assessment & Plan Note (Signed)
Improved; has been taking medication qo day Lower dose Continue to take Can talk about weaning after proper qday dosing  Xanax refilled; database reviewed

## 2021-08-19 LAB — COMPREHENSIVE METABOLIC PANEL
ALT: 16 IU/L (ref 0–32)
AST: 22 IU/L (ref 0–40)
Albumin/Globulin Ratio: 2.5 — ABNORMAL HIGH (ref 1.2–2.2)
Albumin: 4.7 g/dL (ref 3.8–4.8)
Alkaline Phosphatase: 90 IU/L (ref 44–121)
BUN/Creatinine Ratio: 26 (ref 12–28)
BUN: 23 mg/dL (ref 8–27)
Bilirubin Total: 0.3 mg/dL (ref 0.0–1.2)
CO2: 26 mmol/L (ref 20–29)
Calcium: 10 mg/dL (ref 8.7–10.3)
Chloride: 100 mmol/L (ref 96–106)
Creatinine, Ser: 0.89 mg/dL (ref 0.57–1.00)
Globulin, Total: 1.9 g/dL (ref 1.5–4.5)
Glucose: 83 mg/dL (ref 65–99)
Potassium: 4.4 mmol/L (ref 3.5–5.2)
Sodium: 140 mmol/L (ref 134–144)
Total Protein: 6.6 g/dL (ref 6.0–8.5)
eGFR: 72 mL/min/{1.73_m2} (ref >59)

## 2021-08-19 LAB — T4, FREE: Free T4: 1.08 ng/dL (ref 0.82–1.77)

## 2021-08-19 LAB — LIPID PANEL
Chol/HDL Ratio: 2.1 ratio (ref 0.0–4.4)
Cholesterol, Total: 210 mg/dL — ABNORMAL HIGH (ref 100–199)
HDL: 99 mg/dL (ref >39)
LDL Chol Calc (NIH): 104 mg/dL — ABNORMAL HIGH (ref 0–99)
Triglycerides: 38 mg/dL (ref 0–149)
VLDL Cholesterol Cal: 7 mg/dL (ref 5–40)

## 2021-08-19 LAB — CBC WITH DIFFERENTIAL/PLATELET
Basophils Absolute: 0.1 10*3/uL (ref 0.0–0.2)
Basos: 1 %
EOS (ABSOLUTE): 0.1 10*3/uL (ref 0.0–0.4)
Eos: 2 %
Hematocrit: 38.9 % (ref 34.0–46.6)
Hemoglobin: 13.2 g/dL (ref 11.1–15.9)
Immature Grans (Abs): 0 10*3/uL (ref 0.0–0.1)
Immature Granulocytes: 0 %
Lymphocytes Absolute: 1.7 10*3/uL (ref 0.7–3.1)
Lymphs: 27 %
MCH: 28.3 pg (ref 26.6–33.0)
MCHC: 33.9 g/dL (ref 31.5–35.7)
MCV: 84 fL (ref 79–97)
Monocytes Absolute: 0.5 10*3/uL (ref 0.1–0.9)
Monocytes: 8 %
Neutrophils Absolute: 3.8 10*3/uL (ref 1.4–7.0)
Neutrophils: 62 %
Platelets: 242 10*3/uL (ref 150–450)
RBC: 4.66 x10E6/uL (ref 3.77–5.28)
RDW: 12.8 % (ref 11.7–15.4)
WBC: 6.2 10*3/uL (ref 3.4–10.8)

## 2021-08-19 LAB — HEMOGLOBIN A1C
Est. average glucose Bld gHb Est-mCnc: 120 mg/dL
Hgb A1c MFr Bld: 5.8 % — ABNORMAL HIGH (ref 4.8–5.6)

## 2021-08-19 LAB — VITAMIN B12: Vitamin B-12: 877 pg/mL (ref 232–1245)

## 2021-08-19 LAB — TSH: TSH: 2.28 u[IU]/mL (ref 0.450–4.500)

## 2021-08-19 LAB — MAGNESIUM: Magnesium: 2.2 mg/dL (ref 1.6–2.3)

## 2021-08-19 LAB — VITAMIN D 25 HYDROXY (VIT D DEFICIENCY, FRACTURES): Vit D, 25-Hydroxy: 55.9 ng/mL (ref 30.0–100.0)

## 2021-08-21 ENCOUNTER — Ambulatory Visit: Payer: BC Managed Care – PPO | Admitting: Family Medicine

## 2021-10-20 IMAGING — MG MM DIGITAL SCREENING BILAT W/ TOMO AND CAD
6 of 10 series · 6 of 30 positions shown · non-contrast
Comparison: Previous exam(s).

CLINICAL DATA: Screening.

EXAM:
DIGITAL SCREENING BILATERAL MAMMOGRAM WITH TOMOSYNTHESIS AND CAD

[R CC synth-2D]
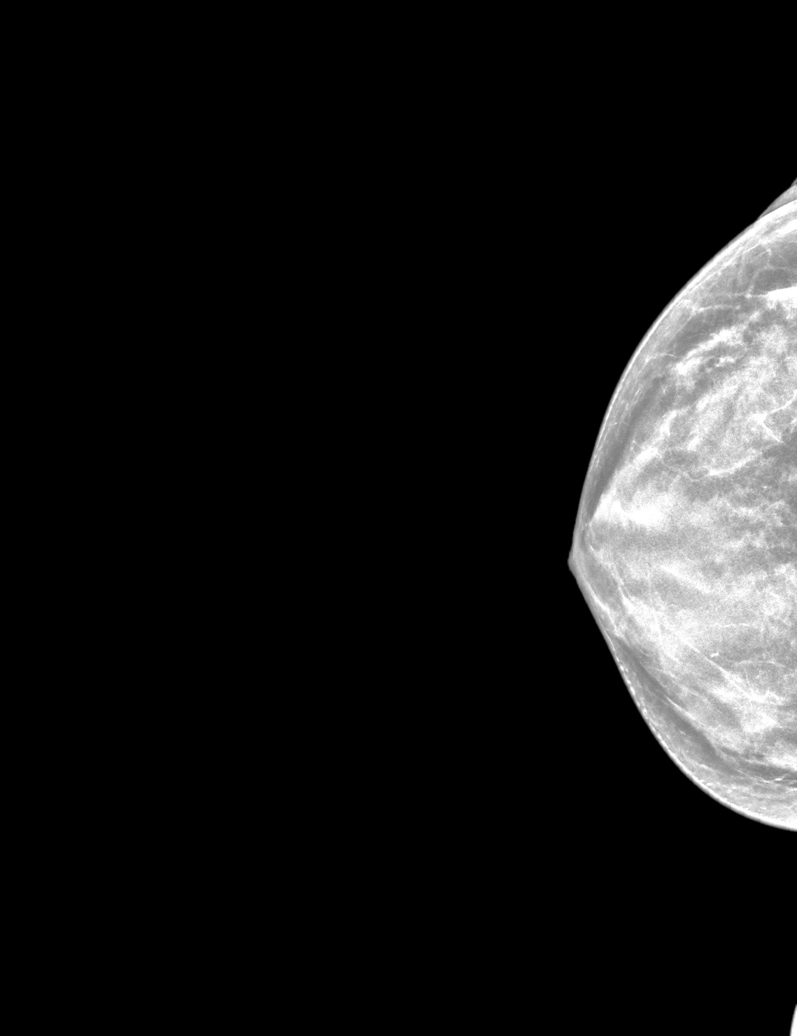

[L CC synth-2D]
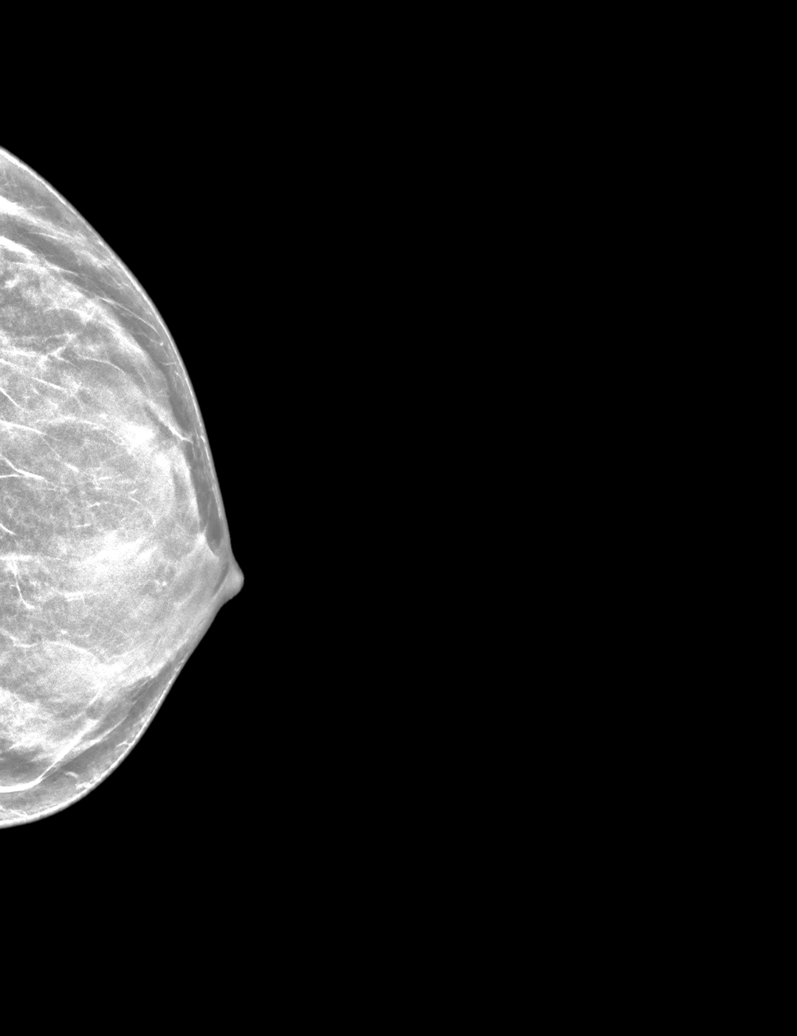

[R MLO synth-2D (1 of 2)]
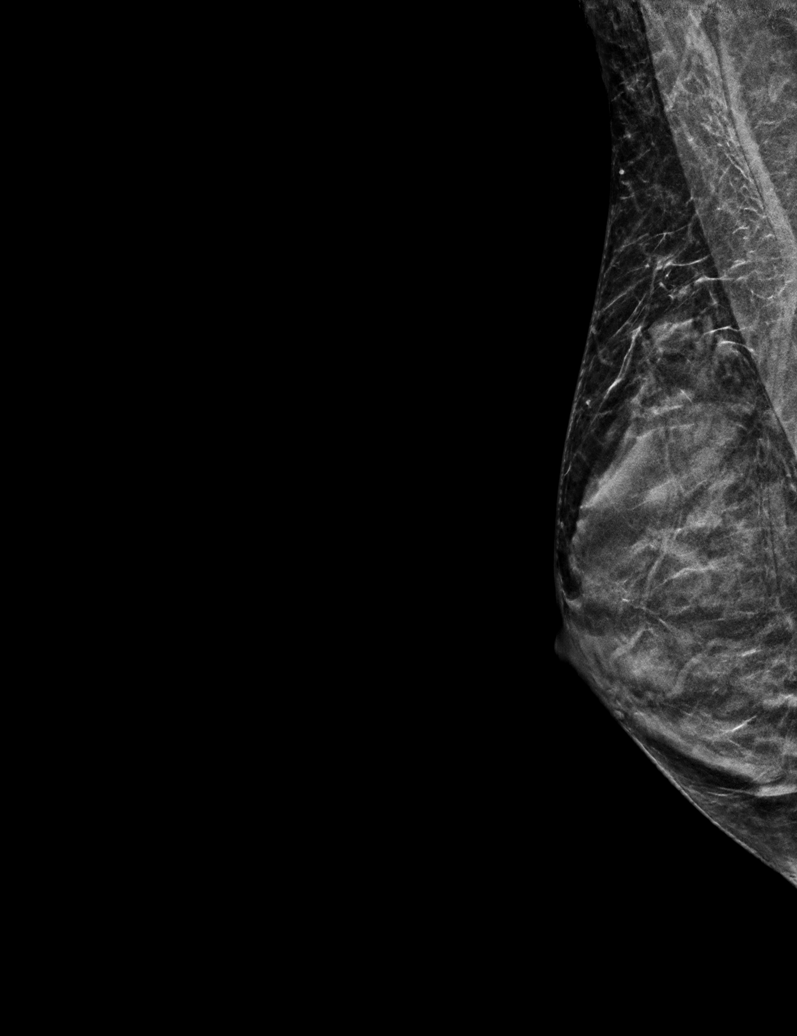

[L MLO synth-2D]
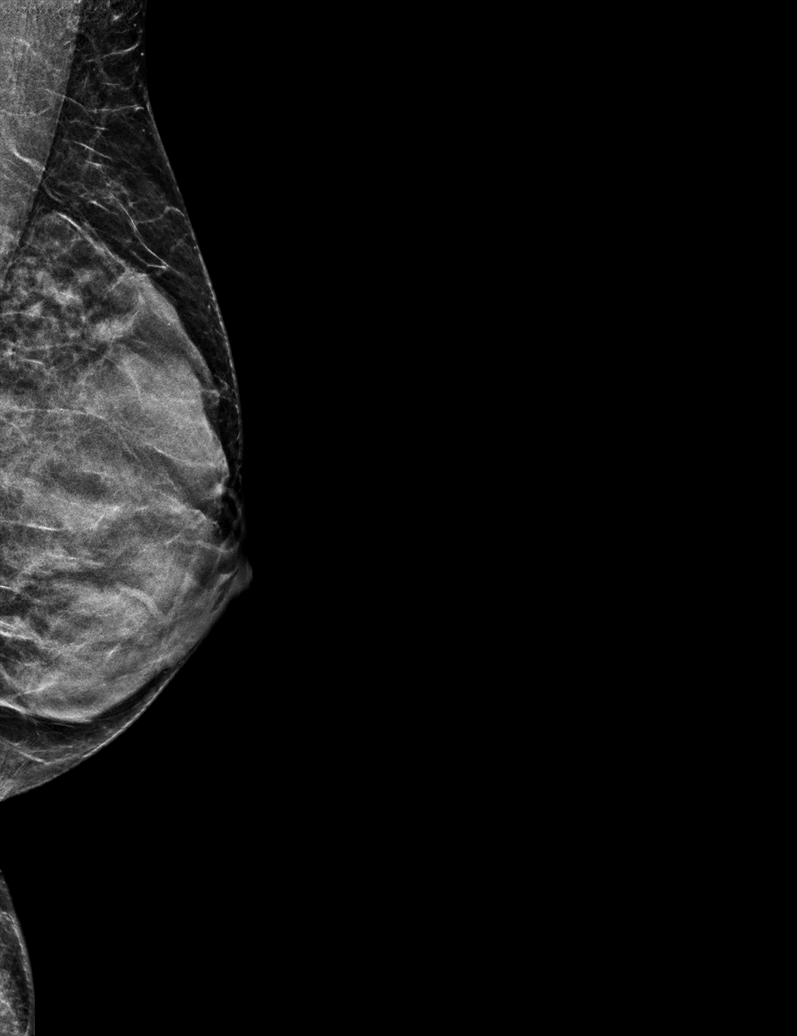

[R MLO synth-2D (2 of 2)]
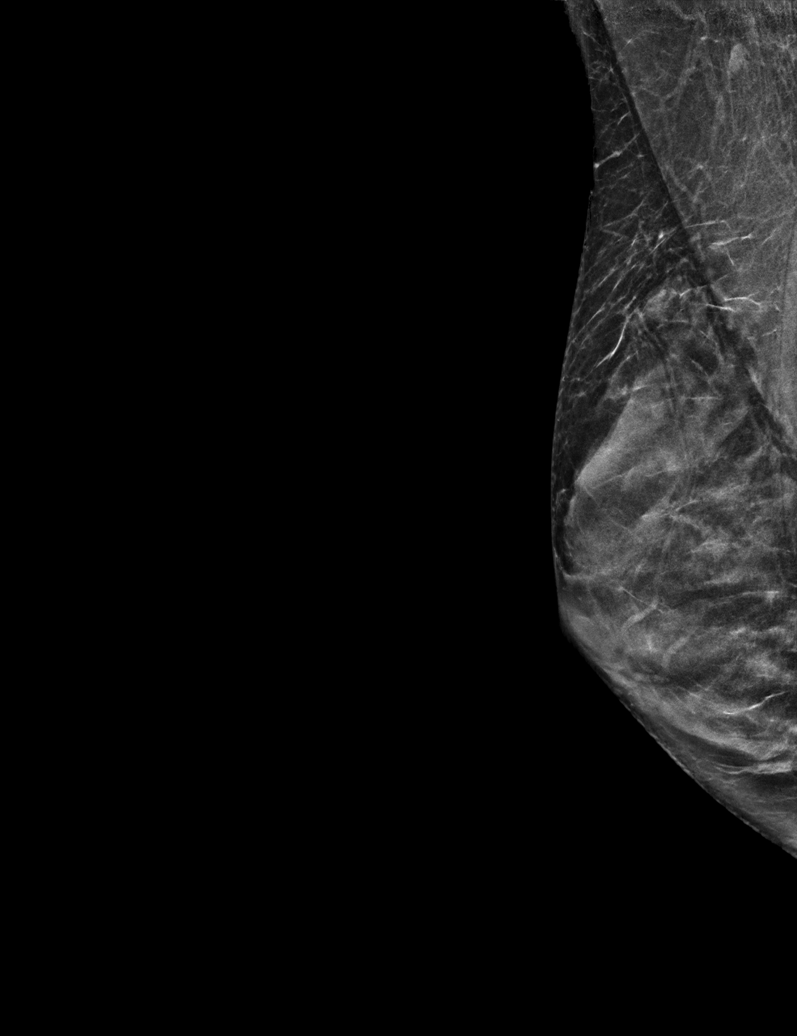

[R MLO tomo · tomo slice 26/51.0]
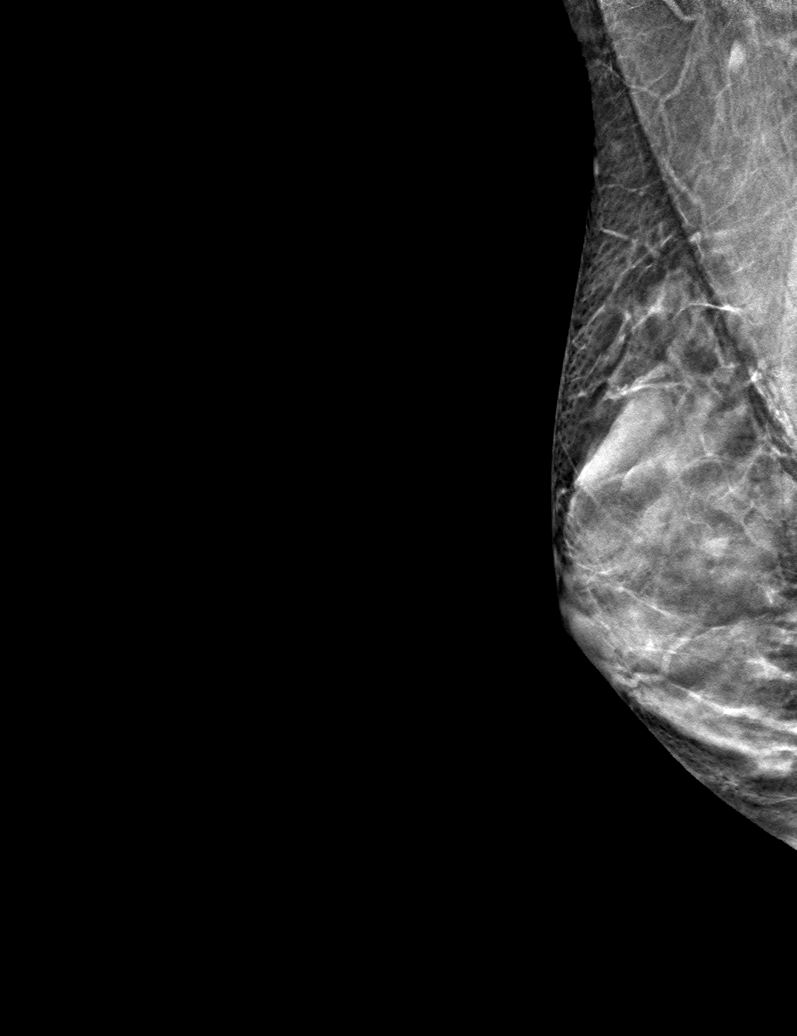

[6 of 30 positions shown; findings below may reference images not displayed]

ACR Breast Density Category d: The breast tissue is extremely dense,
which lowers the sensitivity of mammography
FINDINGS: There are no findings suspicious for malignancy. The images were
evaluated with computer-aided detection.
IMPRESSION: No mammographic evidence of malignancy. A result letter of this
screening mammogram will be mailed directly to the patient.

RECOMMENDATION:
Screening mammogram in one year. (Code:8A-W-DEY)

BI-RADS CATEGORY  1: Negative.

## 2021-10-30 ENCOUNTER — Other Ambulatory Visit: Payer: Self-pay | Admitting: Family Medicine

## 2021-10-30 DIAGNOSIS — F3342 Major depressive disorder, recurrent, in full remission: Secondary | ICD-10-CM

## 2021-11-16 ENCOUNTER — Ambulatory Visit: Payer: BC Managed Care – PPO | Admitting: Family Medicine

## 2021-12-20 DIAGNOSIS — H33322 Round hole, left eye: Secondary | ICD-10-CM | POA: Diagnosis not present

## 2022-01-12 ENCOUNTER — Other Ambulatory Visit: Payer: Self-pay

## 2022-01-12 ENCOUNTER — Ambulatory Visit: Payer: BC Managed Care – PPO | Admitting: Family Medicine

## 2022-01-12 ENCOUNTER — Encounter: Payer: Self-pay | Admitting: Family Medicine

## 2022-01-12 VITALS — BP 154/90 | HR 75 | Temp 98.2°F | Resp 16 | Wt 117.0 lb

## 2022-01-12 DIAGNOSIS — J069 Acute upper respiratory infection, unspecified: Secondary | ICD-10-CM | POA: Diagnosis not present

## 2022-01-12 DIAGNOSIS — R052 Subacute cough: Secondary | ICD-10-CM | POA: Diagnosis not present

## 2022-01-12 MED ORDER — BENZONATATE 100 MG PO CAPS: 100.0000 mg | ORAL_CAPSULE | Freq: Two times a day (BID) | ORAL | 0 refills | Status: DC | PRN

## 2022-01-12 MED ORDER — AZITHROMYCIN 250 MG PO TABS
ORAL_TABLET | ORAL | 0 refills | Status: AC
Start: 2022-01-12 — End: 2022-01-17

## 2022-01-12 NOTE — Progress Notes (Signed)
Established patient visit   Patient: Madison Reese   DOB: Sep 26, 1956   66 y.o. Female  MRN: [id number] Visit Date: 01/12/2022  Today's healthcare provider: Lelon Huh, MD   Chief Complaint  Patient presents with   Cough   Subjective    Cough This is a new problem. Episode onset: 6 days ago. The problem has been gradually worsening. The cough is Non-productive. Associated symptoms include postnasal drip. Pertinent negatives include no chest pain, chills, ear congestion, ear pain, fever, headaches, heartburn, hemoptysis, myalgias, nasal congestion, rhinorrhea, sore throat, shortness of breath, sweats, weight loss or wheezing. Treatments tried: OTC Robitussin cough and chest congestion. The treatment provided mild relief.     Medications: Outpatient Medications Prior to Visit  Medication Sig   ALPRAZolam (XANAX) 0.5 MG tablet Take 1 tablet (0.5 mg total) by mouth 3 (three) times daily as needed for anxiety.   CALCIUM CARBONATE-VIT D-MIN PO Take by mouth.   conjugated estrogens (PREMARIN) vaginal cream Place 1 Applicatorful vaginally once a week.   estradiol (ESTRACE VAGINAL) 0.1 MG/GM vaginal cream Place 1 Applicatorful vaginally at bedtime.   Lactobacillus (ULTIMATE PROBIOTIC FORMULA) CAPS Take by mouth.   meloxicam (MOBIC) 15 MG tablet Take 1 tablet (15 mg total) by mouth daily.   Multiple Vitamin tablet Take by mouth.   Nutritional Supplements (IMMUNE ENHANCE) TABS Take by mouth.   Omega-3 Fatty Acids (FISH OIL) 1200 MG CAPS Take by mouth.   PARoxetine (PAXIL) 20 MG tablet Take 1 tablet (20 mg total) by mouth daily.   valACYclovir (VALTREX) 500 MG tablet Take 1 tablet (500 mg total) by mouth daily.   [DISCONTINUED] estradiol (ESTRING) 2 MG vaginal ring Place 2 mg vaginally every 3 (three) months. follow package directions (Patient not taking: Reported on 01/12/2022)   No facility-administered medications prior to visit.    Review of Systems  Constitutional:  Negative  for chills, fever and weight loss.  HENT:  Positive for congestion (chest congestion) and postnasal drip. Negative for ear pain, rhinorrhea and sore throat.   Respiratory:  Positive for cough. Negative for hemoptysis, shortness of breath and wheezing.   Cardiovascular:  Negative for chest pain.  Gastrointestinal:  Negative for heartburn.  Musculoskeletal:  Negative for myalgias.  Neurological:  Negative for headaches.      Objective    BP (!) 154/90 (BP Location: Right Arm, Patient Position: Sitting, Cuff Size: Normal)    Pulse 75    Temp 98.2 F (36.8 C) (Oral)    Resp 16    Wt 117 lb (53.1 kg)    SpO2 98% Comment: room air   BMI 20.73 kg/m  {Show previous vital signs (optional):23777}  Physical Exam  General Appearance:    Well developed, well nourished female, alert, cooperative, in no acute distress  HENT:   bilateral TM normal without fluid or infection, neck without nodes, sinuses nontender, post nasal drip noted, and nasal mucosa congested  Eyes:    PERRL, conjunctiva/corneas clear, EOM's intact       Lungs:     Clear to auscultation bilaterally, respirations unlabored  Heart:    Normal heart rate. Normal rhythm. No murmurs, rubs, or gallops.    Neurologic:   Awake, alert, oriented x 3. No apparent focal neurological           defect.         Assessment & Plan    1. Subacute cough Nearing second week of sx. Start - benzonatate (TESSALON)  100 MG capsule; Take 1 capsule (100 mg total) by mouth 2 (two) times daily as needed for cough.  Dispense: 20 capsule; Refill: 0  2. Upper respiratory tract infection, unspecified type Suspect atypical respiratory infection. Cover with  - azithromycin (ZITHROMAX) 250 MG tablet; Take 2 tablets on day 1, then 1 tablet daily on days 2 through 5  Dispense: 6 tablet; Refill: 0        The entirety of the information documented in the History of Present Illness, Review of Systems and Physical Exam were personally obtained by me. Portions of this  information were initially documented by the CMA and reviewed by me for thoroughness and accuracy.     Lelon Huh, MD  [facility] [phone] (phone) [phone] (fax)  [place]

## 2022-03-06 DIAGNOSIS — D225 Melanocytic nevi of trunk: Secondary | ICD-10-CM | POA: Diagnosis not present

## 2022-03-06 DIAGNOSIS — L601 Onycholysis: Secondary | ICD-10-CM | POA: Diagnosis not present

## 2022-03-06 DIAGNOSIS — D2261 Melanocytic nevi of right upper limb, including shoulder: Secondary | ICD-10-CM | POA: Diagnosis not present

## 2022-03-06 DIAGNOSIS — D2262 Melanocytic nevi of left upper limb, including shoulder: Secondary | ICD-10-CM | POA: Diagnosis not present

## 2022-08-07 ENCOUNTER — Other Ambulatory Visit: Payer: Self-pay | Admitting: Family Medicine

## 2022-08-07 DIAGNOSIS — Z8619 Personal history of other infectious and parasitic diseases: Secondary | ICD-10-CM

## 2022-08-07 MED ORDER — VALACYCLOVIR HCL 500 MG PO TABS: 500.0000 mg | ORAL_TABLET | Freq: Every day | ORAL | 1 refills | Status: DC

## 2022-08-07 NOTE — Telephone Encounter (Signed)
Requested Prescriptions  Pending Prescriptions Disp Refills  . valACYclovir (VALTREX) 500 MG tablet 90 tablet 1    Sig: Take 1 tablet (500 mg total) by mouth daily.     Antimicrobials:  Antiviral Agents - Anti-Herpetic Passed - 08/07/2022  3:23 PM      Passed - Valid encounter within last 12 months    Recent Outpatient Visits          6 months ago Subacute cough   [facility] Malva Limes, MD   11 months ago Vaginal dryness, menopausal   [facility] Jacky Kindle, FNP   1 year ago Sore throat   [facility] Trey Sailors, [place]   1 year ago Annual physical exam   [facility] Joycelyn Man M, [place]   3 years ago Acute non-recurrent frontal sinusitis   [facility] Osvaldo Angst [place], [place]

## 2022-08-07 NOTE — Telephone Encounter (Signed)
Copied from CRM [id number]. Topic: General - Other >> Aug 07, 2022  1:37 PM Everette C wrote: Reason for CRM: Medication Refill - Medication: valACYclovir (VALTREX) 500 MG tablet [[id number]]   Has the patient contacted their pharmacy? No. The patient was uncertain of the medication's status  (Agent: If no, request that the patient contact the pharmacy for the refill. If patient does not wish to contact the pharmacy document the reason why and proceed with request.) (Agent: If yes, when and what did the pharmacy advise?)  Preferred Pharmacy (with phone number or street name): [facility] [address]), [place] - [address]) [place] [address] Phone: [phone] Fax: [phone] Hours: Not open 24 hours   Has the patient been seen for an appointment in the last year OR does the patient have an upcoming appointment? Yes.    Agent: Please be advised that RX refills may take up to 3 business days. We ask that you follow-up with your pharmacy.

## 2022-08-21 ENCOUNTER — Other Ambulatory Visit: Payer: Self-pay | Admitting: Family Medicine

## 2022-08-21 DIAGNOSIS — F419 Anxiety disorder, unspecified: Secondary | ICD-10-CM

## 2022-08-21 DIAGNOSIS — N951 Menopausal and female climacteric states: Secondary | ICD-10-CM

## 2022-09-18 ENCOUNTER — Other Ambulatory Visit (HOSPITAL_BASED_OUTPATIENT_CLINIC_OR_DEPARTMENT_OTHER): Payer: Self-pay

## 2022-09-21 ENCOUNTER — Other Ambulatory Visit (HOSPITAL_BASED_OUTPATIENT_CLINIC_OR_DEPARTMENT_OTHER): Payer: Self-pay

## 2022-09-21 MED ORDER — FLUAD QUADRIVALENT 0.5 ML IM PRSY
PREFILLED_SYRINGE | INTRAMUSCULAR | 0 refills | Status: DC
  Filled 2022-09-21: qty 0.5, 1d supply, fill #0

## 2022-10-19 ENCOUNTER — Other Ambulatory Visit (HOSPITAL_BASED_OUTPATIENT_CLINIC_OR_DEPARTMENT_OTHER): Payer: Self-pay

## 2022-10-24 ENCOUNTER — Ambulatory Visit: Payer: Self-pay | Admitting: *Deleted

## 2022-10-24 NOTE — Telephone Encounter (Signed)
Message from Moody sent at 10/24/2022  2:59 PM EST  Summary: Pt requests otc med for cold and congestion symptoms   Pt requests call back to advise of otc medications she could try to help with cold and congestion symptoms. Cb# [phone]          Call History   Type Contact Phone/Fax User  10/24/2022 02:56 PM EST Phone (Incoming) Madison Reese, Madison Reese (Self) [phone] Judie Petit) Marylen Ponto   Reason for Disposition  Common cold with no complications  Answer Assessment - Initial Assessment Questions 1. ONSET: "When did the nasal discharge start?"      Started a couple of weeks ago.   Throat was sore on left side, next day both sides.  3 days it went away.    A head cold started with congestion.   I missed 3 days of work.   I felt so bad.   I've been taking Nightquil that helped.  I'm still taking the Sudaphed which has helped.   I'm still blowing my nose a lot.   It's clear to yellow.    I feel better now. I lost my appetite but it's back now.    I did not test for Covid.   I could have had it.    2. AMOUNT: "How much discharge is there?"      I'm still blowing my nose a lot.    3. COUGH: "Do you have a cough?" If Yes, ask: "Describe the color of your sputum" (clear, white, yellow, green)     No coughing 4. RESPIRATORY DISTRESS: "Describe your breathing."      No 5. FEVER: "Do you have a fever?" If Yes, ask: "What is your temperature, how was it measured, and when did it start?"     No 6. SEVERITY: "Overall, how bad are you feeling right now?" (e.g., doesn't interfere with normal activities, staying home from school/work, staying in bed)      I'm feeling better. 7. OTHER SYMPTOMS: "Do you have any other symptoms?" (e.g., sore throat, earache, wheezing, vomiting)     I did have a headache but it's gone now.   No sinus pressure.   But I am blowing my nose a lot.   I'm tired.    8. PREGNANCY: "Is there any chance you are pregnant?" "When was your last menstrual period?"      N/A due to age  Protocols used: Common Cold-A-AH

## 2022-10-24 NOTE — Telephone Encounter (Signed)
  Chief Complaint: nasal congestion that started a couple of weeks ago.   Feeling better now but still having nasal congestion.   No coughing or green mucus.   Requesting some OTC medications she could try.   I went over the home care advice with her. Symptoms: nasal congestion clear-yellow mucus but a lot. Frequency: For a couple of weeks now Pertinent Negatives: Patient denies fever or coughing or sore throat.   Disposition: [] ED /[] Urgent Care (no appt availability in office) / [] Appointment(In office/virtual)/ []  [facility]/ [x] Home Care/ [] Refused Recommended Disposition /[] [facility]/ []  Follow-up with PCP Additional Notes: I let her know to call back if using the nasal spray and sinus rinses did not help in a few days.

## 2022-10-26 ENCOUNTER — Other Ambulatory Visit: Payer: Self-pay | Admitting: Family Medicine

## 2022-10-26 DIAGNOSIS — F419 Anxiety disorder, unspecified: Secondary | ICD-10-CM

## 2022-10-26 NOTE — Telephone Encounter (Signed)
Requested Prescriptions  Pending Prescriptions Disp Refills   PARoxetine (PAXIL) 20 MG tablet [Pharmacy Med Name: PARoxetine HCl 20 MG Oral Tablet] 90 tablet 0    Sig: Take 1 tablet by mouth once daily     Psychiatry:  Antidepressants - SSRI Failed - 10/26/2022 11:05 AM      Failed - Valid encounter within last 6 months    Recent Outpatient Visits           9 months ago Subacute cough   [facility] Malva Limes, [place]   1 year ago Vaginal dryness, menopausal   [facility] Jacky Kindle, FNP   1 year ago Sore throat   [facility] Trey Sailors, [place]   2 years ago Annual physical exam   [facility] Joycelyn Man M, [place]   3 years ago Acute non-recurrent frontal sinusitis   [facility] Trey Sailors, [place]       Future Appointments             In 1 month Jacky Kindle, FNP [facility]            Passed - Completed PHQ-2 or PHQ-9 in the last 360 days

## 2022-11-27 ENCOUNTER — Encounter: Payer: BC Managed Care – PPO | Admitting: Family Medicine

## 2022-11-28 ENCOUNTER — Encounter: Payer: Self-pay | Admitting: Family Medicine

## 2022-11-28 ENCOUNTER — Ambulatory Visit (INDEPENDENT_AMBULATORY_CARE_PROVIDER_SITE_OTHER): Payer: BC Managed Care – PPO | Admitting: Family Medicine

## 2022-11-28 VITALS — BP 122/86 | HR 78 | Temp 98.1°F | Ht 64.0 in | Wt 116.6 lb

## 2022-11-28 DIAGNOSIS — Z0000 Encounter for general adult medical examination without abnormal findings: Secondary | ICD-10-CM | POA: Diagnosis not present

## 2022-11-28 DIAGNOSIS — F419 Anxiety disorder, unspecified: Secondary | ICD-10-CM | POA: Diagnosis not present

## 2022-11-28 DIAGNOSIS — Z1231 Encounter for screening mammogram for malignant neoplasm of breast: Secondary | ICD-10-CM | POA: Insufficient documentation

## 2022-11-28 DIAGNOSIS — R052 Subacute cough: Secondary | ICD-10-CM | POA: Insufficient documentation

## 2022-11-28 DIAGNOSIS — E7800 Pure hypercholesterolemia, unspecified: Secondary | ICD-10-CM | POA: Diagnosis not present

## 2022-11-28 DIAGNOSIS — F3342 Major depressive disorder, recurrent, in full remission: Secondary | ICD-10-CM

## 2022-11-28 DIAGNOSIS — Z8619 Personal history of other infectious and parasitic diseases: Secondary | ICD-10-CM | POA: Insufficient documentation

## 2022-11-28 DIAGNOSIS — R7303 Prediabetes: Secondary | ICD-10-CM | POA: Diagnosis not present

## 2022-11-28 DIAGNOSIS — R0981 Nasal congestion: Secondary | ICD-10-CM | POA: Diagnosis not present

## 2022-11-28 DIAGNOSIS — Z1211 Encounter for screening for malignant neoplasm of colon: Secondary | ICD-10-CM | POA: Insufficient documentation

## 2022-11-28 DIAGNOSIS — H66001 Acute suppurative otitis media without spontaneous rupture of ear drum, right ear: Secondary | ICD-10-CM | POA: Insufficient documentation

## 2022-11-28 LAB — POCT INFLUENZA A/B
Influenza A, POC: NEGATIVE
Influenza B, POC: NEGATIVE

## 2022-11-28 LAB — POC COVID19 BINAXNOW: SARS Coronavirus 2 Ag: NEGATIVE

## 2022-11-28 MED ORDER — PAROXETINE HCL 20 MG PO TABS: 20.0000 mg | ORAL_TABLET | Freq: Every day | ORAL | 3 refills | Status: DC

## 2022-11-28 MED ORDER — VALACYCLOVIR HCL 500 MG PO TABS: 500.0000 mg | ORAL_TABLET | Freq: Every day | ORAL | 3 refills | Status: DC

## 2022-11-28 MED ORDER — BENZONATATE 100 MG PO CAPS: 100.0000 mg | ORAL_CAPSULE | Freq: Two times a day (BID) | ORAL | 0 refills | Status: DC | PRN

## 2022-11-28 MED ORDER — AMOXICILLIN-POT CLAVULANATE 875-125 MG PO TABS: 1.0000 | ORAL_TABLET | Freq: Two times a day (BID) | ORAL | 0 refills | Status: DC

## 2022-11-28 MED ORDER — ALPRAZOLAM 0.5 MG PO TABS: 0.5000 mg | ORAL_TABLET | Freq: Three times a day (TID) | ORAL | 0 refills | Status: DC | PRN

## 2022-11-28 NOTE — Assessment & Plan Note (Signed)
Chronic, stable PDMP reviewed Wishes to remain on low dose abortive medication to assist Patient is aware of risks of abortive anxiety medication use to include increased sedation, respiratory suppression, falls, dependence and cardiovascular events.  Patient would like to continue treatment as benefit determined to outweigh risk.

## 2022-11-28 NOTE — Assessment & Plan Note (Signed)
Acute, stable R ear involvement Denies loss of hearing Associated with congestion and cough Continue OTC medication to assist in addition to Augmentin

## 2022-11-28 NOTE — Assessment & Plan Note (Signed)
Chronic, elevated >100 The 10-year ASCVD risk score (Arnett DK, et al., 2019) is: 4.6%   Values used to calculate the score:     Age: 66 years     Sex: Female     Is Non-Hispanic African American: No     Diabetic: No     Tobacco smoker: No     Systolic Blood Pressure: 122 mmHg     Is BP treated: No     HDL Cholesterol: 99 mg/dL     Total Cholesterol: 210 mg/dL Repeat LP Goal <096

## 2022-11-28 NOTE — Assessment & Plan Note (Signed)
Chronic, stable Remains on Paxil 20 mg to assist [facility] 2/9 reviewed

## 2022-11-28 NOTE — Assessment & Plan Note (Signed)
Due for screening colonoscopy/colo guard; low risk. No current complaints.

## 2022-11-28 NOTE — Assessment & Plan Note (Signed)
Acute on chronic, one active on vermilion border today Refills provided

## 2022-11-28 NOTE — Assessment & Plan Note (Signed)
Chronic, stable Controlled with diet/exercise Repeat A1c Continue to recommend balanced, lower carb meals. Smaller meal size, adding snacks. Choosing water as drink of choice and increasing purposeful exercise. Goal <7%

## 2022-11-28 NOTE — Patient Instructions (Addendum)
The CDC recommends two doses of Shingrix (the new shingles vaccine) separated by 2 to 6 months for adults age 66 years and older. I recommend checking with your insurance plan regarding coverage for this vaccine.    Schedule pneumonia vaccine once well.  Cologuard will come to home

## 2022-11-28 NOTE — Assessment & Plan Note (Signed)
In setting of R ear infection and congestion Continue supportive care and tessalon to assist

## 2022-11-28 NOTE — Assessment & Plan Note (Signed)
Negative flu and COVID; R ear with effusion Will treat with Augmentin Encourage supportive care as well to assist  RTC if not improved

## 2022-11-28 NOTE — Progress Notes (Signed)
I,Connie R Striblin,acting as a Neurosurgeon for Jacky Kindle, FNP.,have documented all relevant documentation on the behalf of Jacky Kindle, FNP,as directed by  Jacky Kindle, FNP while in the presence of Jacky Kindle, FNP. Complete physical exam  Patient: Madison Reese   DOB: [birth year]   66 y.o. Female  MRN: [id number] Visit Date: 11/28/2022  Today's healthcare provider: Jacky Kindle, FNP  Re Introduced to nurse practitioner role and practice setting.  All questions answered.  Discussed provider/patient relationship and expectations.  Chief Complaint  Patient presents with   Annual Exam   Subjective    Madison Reese is a 66 y.o. female who presents today for a complete physical exam.  She reports consuming a general diet. Gym/ health club routine includes cardio. She generally feels fairly well. She reports sleeping well. She does have additional problems to discuss today.  HPI  Cough: Patient complains of cough. Symptoms began 1 week ago. Cough described as non-productive, worsening over time. Patient denies fever and chills. Associated symptoms include nasal congestion and sneezing. Current treatments have included otc cough medications, with poor improvement.   Pt has no known exposure to Covid or the Flu.  Past Medical History:  Diagnosis Date   Anxiety    Past Surgical History:  Procedure Laterality Date   ABDOMINAL HYSTERECTOMY  2008   Partial- due to menorrhagia   FOOT SURGERY Bilateral 02/2005   removal of 5th digit on bilateral feet   TUBAL LIGATION  1995   Social History   Socioeconomic History   Marital status: Married    Spouse name: Not on file   Number of children: Not on file   Years of education: Not on file   Highest education level: Not on file  Occupational History   Not on file  Tobacco Use   Smoking status: Never   Smokeless tobacco: Never  Vaping Use   Vaping Use: Never used  Substance and Sexual Activity   Alcohol use: No   Drug use:  No   Sexual activity: Yes  Other Topics Concern   Not on file  Social History Narrative   Not on file   Social Determinants of Health   Financial Resource Strain: Not on file  Food Insecurity: Not on file  Transportation Needs: Not on file  Physical Activity: Not on file  Stress: Not on file  Social Connections: Not on file  Intimate Partner Violence: Not on file   Family Status  Relation Name Status   Mother  Deceased   Father  Deceased at age 37   Sister  Alive   Brother  Alive   Mat Uncle  Deceased   MGM  Deceased   Sister  Alive   Neg Hx  (Not Specified)   Family History  Problem Relation Age of Onset   Arthritis Mother    Depression Mother    Hypertension Mother    Cancer Mother 44       Pancreatic cancer   Heart attack Father    Hyperlipidemia Sister    Hypertension Brother    Lung cancer Maternal Uncle    Diabetes Maternal Uncle    Alzheimer's disease Maternal Grandmother    Depression Sister    Breast cancer Neg Hx    No Known Allergies  Patient Care Team: Jacky Kindle, FNP as PCP - General (Family Medicine)   Medications: Outpatient Medications Prior to Visit  Medication Sig   CALCIUM CARBONATE-VIT D-MIN  PO Take by mouth.   Multiple Vitamin tablet Take by mouth.   Nutritional Supplements (IMMUNE ENHANCE) TABS Take by mouth.   Omega-3 Fatty Acids (FISH OIL) 1200 MG CAPS Take by mouth.   [DISCONTINUED] ALPRAZolam (XANAX) 0.5 MG tablet Take 1 tablet (0.5 mg total) by mouth 3 (three) times daily as needed for anxiety.   [DISCONTINUED] benzonatate (TESSALON) 100 MG capsule Take 1 capsule (100 mg total) by mouth 2 (two) times daily as needed for cough.   [DISCONTINUED] conjugated estrogens (PREMARIN) vaginal cream Place 1 Applicatorful vaginally once a week.   [DISCONTINUED] estradiol (ESTRACE) 0.1 MG/GM vaginal cream APPLY 1 APPLICATORFUL VAGINALLY  AT BEDTIME   [DISCONTINUED] influenza vaccine adjuvanted (FLUAD QUADRIVALENT) 0.5 ML injection Inject  into the muscle.   [DISCONTINUED] Lactobacillus (ULTIMATE PROBIOTIC FORMULA) CAPS Take by mouth.   [DISCONTINUED] meloxicam (MOBIC) 15 MG tablet Take 1 tablet (15 mg total) by mouth daily.   [DISCONTINUED] PARoxetine (PAXIL) 20 MG tablet Take 1 tablet by mouth once daily   [DISCONTINUED] valACYclovir (VALTREX) 500 MG tablet Take 1 tablet (500 mg total) by mouth daily.   No facility-administered medications prior to visit.   Review of Systems   Objective    BP 122/86 (BP Location: Left Arm, Patient Position: Sitting, Cuff Size: Normal)   Pulse 78   Temp 98.1 F (36.7 C) (Oral)   Ht 5\' 4"  (1.626 m)   Wt 116 lb 9.6 oz (52.9 kg)   SpO2 100%   BMI 20.01 kg/m   Physical Exam Vitals and nursing note reviewed.  Constitutional:      General: She is awake. She is not in acute distress.    Appearance: Normal appearance. She is well-developed, well-groomed and normal weight. She is not ill-appearing, toxic-appearing or diaphoretic.  HENT:     Head: Normocephalic and atraumatic.     Jaw: There is normal jaw occlusion. No trismus, tenderness, swelling or pain on movement.     Right Ear: Hearing, ear canal and external ear normal. Tenderness present. There is no impacted cerumen. Tympanic membrane is erythematous.     Left Ear: Hearing, tympanic membrane, ear canal and external ear normal. There is no impacted cerumen.     Nose: Nose normal. No congestion or rhinorrhea.     Right Turbinates: Not enlarged, swollen or pale.     Left Turbinates: Not enlarged, swollen or pale.     Right Sinus: No maxillary sinus tenderness or frontal sinus tenderness.     Left Sinus: No maxillary sinus tenderness or frontal sinus tenderness.     Mouth/Throat:     Lips: Pink.     Mouth: Mucous membranes are moist. No injury.     Tongue: No lesions.     Pharynx: Oropharynx is clear. Uvula midline. No pharyngeal swelling, oropharyngeal exudate, posterior oropharyngeal erythema or uvula swelling.     Tonsils: No  tonsillar exudate or tonsillar abscesses.  Eyes:     General: Lids are normal. Lids are everted, no foreign bodies appreciated. Vision grossly intact. Gaze aligned appropriately. No allergic shiner or visual field deficit.       Right eye: No discharge.        Left eye: No discharge.     Extraocular Movements: Extraocular movements intact.     Conjunctiva/sclera: Conjunctivae normal.     Right eye: Right conjunctiva is not injected. No exudate.    Left eye: Left conjunctiva is not injected. No exudate.    Pupils: Pupils are equal, round, and  reactive to light.  Neck:     Thyroid: No thyroid mass, thyromegaly or thyroid tenderness.     Vascular: No carotid bruit.     Trachea: Trachea normal.  Cardiovascular:     Rate and Rhythm: Normal rate and regular rhythm.     Pulses: Normal pulses.          Carotid pulses are 2+ on the right side and 2+ on the left side.      Radial pulses are 2+ on the right side and 2+ on the left side.       Dorsalis pedis pulses are 2+ on the right side and 2+ on the left side.       Posterior tibial pulses are 2+ on the right side and 2+ on the left side.     Heart sounds: Normal heart sounds, S1 normal and S2 normal. No murmur heard.    No friction rub. No gallop.  Pulmonary:     Effort: Pulmonary effort is normal. No respiratory distress.     Breath sounds: Normal breath sounds and air entry. No stridor. No wheezing, rhonchi or rales.  Chest:     Chest wall: No tenderness.  Abdominal:     General: Abdomen is flat. Bowel sounds are normal. There is no distension.     Palpations: Abdomen is soft. There is no mass.     Tenderness: There is no abdominal tenderness. There is no right CVA tenderness, left CVA tenderness, guarding or rebound.     Hernia: No hernia is present.  Genitourinary:    Comments: Exam deferred; denies complaints Musculoskeletal:        General: No swelling, tenderness, deformity or signs of injury. Normal range of motion.     Cervical  back: Full passive range of motion without pain, normal range of motion and neck supple. No edema, rigidity or tenderness. No muscular tenderness.     Right lower leg: No edema.     Left lower leg: No edema.  Lymphadenopathy:     Cervical: No cervical adenopathy.     Right cervical: No superficial, deep or posterior cervical adenopathy.    Left cervical: No superficial, deep or posterior cervical adenopathy.  Skin:    General: Skin is warm and dry.     Capillary Refill: Capillary refill takes less than 2 seconds.     Coloration: Skin is not jaundiced or pale.     Findings: No bruising, erythema, lesion or rash.  Neurological:     General: No focal deficit present.     Mental Status: She is alert and oriented to person, place, and time. Mental status is at baseline.     GCS: GCS eye subscore is 4. GCS verbal subscore is 5. GCS motor subscore is 6.     Sensory: Sensation is intact. No sensory deficit.     Motor: Motor function is intact. No weakness.     Coordination: Coordination is intact. Coordination normal.     Gait: Gait is intact. Gait normal.  Psychiatric:        Attention and Perception: Attention and perception normal.        Mood and Affect: Mood and affect normal.        Speech: Speech normal.        Behavior: Behavior normal. Behavior is cooperative.        Thought Content: Thought content normal.        Cognition and Memory: Cognition and memory normal.  Judgment: Judgment normal.    Last depression screening scores    11/28/2022    8:49 AM 01/12/2022   11:21 AM 08/18/2021    1:57 PM  PHQ 2/9 Scores  PHQ - 2 Score 0 0 0  PHQ- 9 Score 0 0 0   Last fall risk screening    11/28/2022    8:50 AM  Fall Risk   Falls in the past year? 0  Number falls in past yr: 0  Injury with Fall? 0  Follow up Falls prevention discussed   Last Audit-C alcohol use screening    11/28/2022    8:49 AM  Alcohol Use Disorder Test (AUDIT)  1. How often do you have a drink  containing alcohol? 0  2. How many drinks containing alcohol do you have on a typical day when you are drinking? 0  3. How often do you have six or more drinks on one occasion? 0  AUDIT-C Score 0  Alcohol Brief Interventions/Follow-up Patient Refused   A score of 3 or more in women, and 4 or more in men indicates increased risk for alcohol abuse, EXCEPT if all of the points are from question 1   No results found for any visits on 11/28/22.  Assessment & Plan    Routine Health Maintenance and Physical Exam  Exercise Activities and Dietary recommendations  Goals   None     Immunization History  Administered Date(s) Administered   Fluad Quad(high Dose 65+) 09/21/2022   Td 11/24/2018   Tdap 10/01/2007    Health Maintenance  Topic Date Due   Zoster Vaccines- Shingrix (1 of 2) Never done   COLONOSCOPY (Pts 45-73yrs Insurance coverage will need to be confirmed)  11/25/2017   Pneumonia Vaccine 60+ Years old (1 - PCV) Never done   COVID-19 Vaccine (1) 12/14/2022 (Originally 02/09/1957)   MAMMOGRAM  01/12/2023   DTaP/Tdap/Td (3 - Td or Tdap) 11/24/2028   INFLUENZA VACCINE  Completed   DEXA SCAN  Completed   Hepatitis C Screening  Completed   HPV VACCINES  Aged Out    Discussed health benefits of physical activity, and encouraged her to engage in regular exercise appropriate for her age and condition. Problem List Items Addressed This Visit       Respiratory   Congestion of nasal sinus    Negative flu and COVID; R ear with effusion Will treat with Augmentin Encourage supportive care as well to assist  RTC if not improved       Relevant Orders   POCT Influenza A/B   POC COVID-19     Nervous and Auditory   Non-recurrent acute suppurative otitis media of right ear without spontaneous rupture of tympanic membrane    Acute, stable R ear involvement Denies loss of hearing Associated with congestion and cough Continue OTC medication to assist in addition to Augmentin        Relevant Medications   valACYclovir (VALTREX) 500 MG tablet   amoxicillin-clavulanate (AUGMENTIN) 875-125 MG tablet     Other   Annual physical exam - Primary    UTD on dental and vision Things to do to keep yourself healthy  - Exercise at least 30-45 minutes a day, 3-4 days a week.  - Eat a low-fat diet with lots of fruits and vegetables, up to 7-9 servings per day.  - Seatbelts can save your life. Wear them always.  - Smoke detectors on every level of your home, check batteries every year.  - Eye Doctor -  have an eye exam every 1-2 years  - Safe sex - if you may be exposed to STDs, use a condom.  - Alcohol -  If you drink, do it moderately, less than 2 drinks per day.  - Health Care Power of Attorney. Choose someone to speak for you if you are not able.  - Depression is common in our stressful world.If you're feeling down or losing interest in things you normally enjoy, please come in for a visit.  - Violence - If anyone is threatening or hurting you, please call immediately.       Relevant Orders   Comprehensive Metabolic Panel (CMET)   TSH   Lipid panel   CBC   Hemoglobin A1c   Anxiety    Chronic, stable PDMP reviewed Wishes to remain on low dose abortive medication to assist Patient is aware of risks of abortive anxiety medication use to include increased sedation, respiratory suppression, falls, dependence and cardiovascular events.  Patient would like to continue treatment as benefit determined to outweigh risk.         Relevant Medications   ALPRAZolam (XANAX) 0.5 MG tablet   PARoxetine (PAXIL) 20 MG tablet   Breast cancer screening by mammogram    Due for screening for mammogram, denies breast concerns, provided with phone number to call and schedule appointment for mammogram. Encouraged to repeat breast cancer screening every 1-2 years.       Relevant Orders   MM 3D SCREEN BREAST BILATERAL   Clinical depression    Chronic, stable Remains on Paxil 20 mg to  assist PHQ 2/9 reviewed       Relevant Medications   ALPRAZolam (XANAX) 0.5 MG tablet   PARoxetine (PAXIL) 20 MG tablet   Elevated LDL cholesterol level    Chronic, elevated >100 The 10-year ASCVD risk score (Arnett DK, et al., 2019) is: 4.6%   Values used to calculate the score:     Age: 50 years     Sex: Female     Is Non-Hispanic African American: No     Diabetic: No     Tobacco smoker: No     Systolic Blood Pressure: 122 mmHg     Is BP treated: No     HDL Cholesterol: 99 mg/dL     Total Cholesterol: 210 mg/dL Repeat LP Goal <967       Relevant Orders   Lipid panel   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 Smaller meal size, adding snacks. Choosing water as drink of choice and increasing purposeful exercise. Goal <7%      Relevant Orders   Hemoglobin A1c   Screening for colon cancer    Due for screening colonoscopy/colo guard; low risk. No current complaints.      Relevant Orders   Cologuard   Subacute cough    In setting of R ear infection and congestion Continue supportive care and tessalon to assist      Relevant Medications   benzonatate (TESSALON) 100 MG capsule   Return in about 1 year (around 11/29/2023).    Madison Merl, FNP, have reviewed all documentation for this visit. The documentation on 11/28/22 for the exam, diagnosis, procedures, and orders are all accurate and  complete.  Jacky KindleElise T Josy Peaden, FNP  [facility] [phone] (phone) [phone] (fax)  [facility]

## 2022-11-28 NOTE — Assessment & Plan Note (Signed)
Due for screening for mammogram, denies breast concerns, provided with phone number to call and schedule appointment for mammogram. Encouraged to repeat breast cancer screening every 1-2 years.  

## 2022-11-28 NOTE — Assessment & Plan Note (Signed)
UTD on dental and vision Things to do to keep yourself healthy  - Exercise at least 30-45 minutes a day, 3-4 days a week.  - Eat a low-fat diet with lots of fruits and vegetables, up to 7-9 servings per day.  - Seatbelts can save your life. Wear them always.  - Smoke detectors on every level of your home, check batteries every year.  - Eye Doctor - have an eye exam every 1-2 years  - Safe sex - if you may be exposed to STDs, use a condom.  - Alcohol -  If you drink, do it moderately, less than 2 drinks per day.  - Health Care Power of Attorney. Choose someone to speak for you if you are not able.  - Depression is common in our stressful world.If you're feeling down or losing interest in things you normally enjoy, please come in for a visit.  - Violence - If anyone is threatening or hurting you, please call immediately.  

## 2022-11-29 ENCOUNTER — Telehealth: Payer: Self-pay

## 2022-11-29 LAB — LIPID PANEL
Chol/HDL Ratio: 2.3 ratio (ref 0.0–4.4)
Cholesterol, Total: 204 mg/dL — ABNORMAL HIGH (ref 100–199)
HDL: 87 mg/dL (ref >39)
LDL Chol Calc (NIH): 106 mg/dL — ABNORMAL HIGH (ref 0–99)
Triglycerides: 59 mg/dL (ref 0–149)
VLDL Cholesterol Cal: 11 mg/dL (ref 5–40)

## 2022-11-29 LAB — COMPREHENSIVE METABOLIC PANEL
ALT: 15 IU/L (ref 0–32)
AST: 22 IU/L (ref 0–40)
Albumin/Globulin Ratio: 1.9 (ref 1.2–2.2)
Albumin: 4.5 g/dL (ref 3.9–4.9)
Alkaline Phosphatase: 105 IU/L (ref 44–121)
BUN/Creatinine Ratio: 16 (ref 12–28)
BUN: 17 mg/dL (ref 8–27)
Bilirubin Total: 0.3 mg/dL (ref 0.0–1.2)
CO2: 27 mmol/L (ref 20–29)
Calcium: 10 mg/dL (ref 8.7–10.3)
Chloride: 97 mmol/L (ref 96–106)
Creatinine, Ser: 1.04 mg/dL — ABNORMAL HIGH (ref 0.57–1.00)
Globulin, Total: 2.4 g/dL (ref 1.5–4.5)
Glucose: 104 mg/dL — ABNORMAL HIGH (ref 70–99)
Potassium: 5.1 mmol/L (ref 3.5–5.2)
Sodium: 141 mmol/L (ref 134–144)
Total Protein: 6.9 g/dL (ref 6.0–8.5)
eGFR: 59 mL/min/{1.73_m2} — ABNORMAL LOW (ref >59)

## 2022-11-29 LAB — CBC
Hematocrit: 42.1 % (ref 34.0–46.6)
Hemoglobin: 13.7 g/dL (ref 11.1–15.9)
MCH: 27.5 pg (ref 26.6–33.0)
MCHC: 32.5 g/dL (ref 31.5–35.7)
MCV: 84 fL (ref 79–97)
Platelets: 225 10*3/uL (ref 150–450)
RBC: 4.99 x10E6/uL (ref 3.77–5.28)
RDW: 13.2 % (ref 11.7–15.4)
WBC: 4.7 10*3/uL (ref 3.4–10.8)

## 2022-11-29 LAB — TSH: TSH: 3.01 u[IU]/mL (ref 0.450–4.500)

## 2022-11-29 LAB — HEMOGLOBIN A1C
Est. average glucose Bld gHb Est-mCnc: 123 mg/dL
Hgb A1c MFr Bld: 5.9 % — ABNORMAL HIGH (ref 4.8–5.6)

## 2022-11-29 NOTE — Telephone Encounter (Signed)
Copied from CRM [id number]. Topic: General - Other >> Nov 29, 2022  3:19 PM Lyman Speller wrote: Reason for CRM: when Junious Dresser returns pts call can she please call her cell

## 2022-11-29 NOTE — Progress Notes (Signed)
Decrease noted in kidney function from previous year. Continue to prioritize water to assist, goal of 48-64 oz/day. Recommend repeat in 3 months if desired.  Cholesterol remains elevated; both total and bad/LDL cholesterol. I continue to recommend diet low in saturated fat and regular exercise - 30 min at least 5 times per week. The 10-year ASCVD risk score (Arnett DK, et al., 2019) is: 4.8%   Values used to calculate the score:     Age: 66 years     Sex: Female     Is Non-Hispanic African American: No     Diabetic: No     Tobacco smoker: No     Systolic Blood Pressure: 122 mmHg     Is BP treated: No     HDL Cholesterol: 87 mg/dL     Total Cholesterol: 204 mg/dL  H6Y continues to show stable pre-diabetes. Continue to recommend balanced, lower carb meals. Smaller meal size, adding snacks. Choosing water as drink of choice and increasing purposeful exercise.  Thyroid and cell count normal.

## 2023-01-02 DIAGNOSIS — H33322 Round hole, left eye: Secondary | ICD-10-CM | POA: Diagnosis not present

## 2023-02-01 ENCOUNTER — Other Ambulatory Visit: Payer: Self-pay | Admitting: Family Medicine

## 2023-03-12 DIAGNOSIS — L439 Lichen planus, unspecified: Secondary | ICD-10-CM | POA: Diagnosis not present

## 2023-03-12 DIAGNOSIS — D2262 Melanocytic nevi of left upper limb, including shoulder: Secondary | ICD-10-CM | POA: Diagnosis not present

## 2023-03-12 DIAGNOSIS — D485 Neoplasm of uncertain behavior of skin: Secondary | ICD-10-CM | POA: Diagnosis not present

## 2023-03-12 DIAGNOSIS — D2261 Melanocytic nevi of right upper limb, including shoulder: Secondary | ICD-10-CM | POA: Diagnosis not present

## 2023-03-12 DIAGNOSIS — L814 Other melanin hyperpigmentation: Secondary | ICD-10-CM | POA: Diagnosis not present

## 2023-03-12 DIAGNOSIS — L565 Disseminated superficial actinic porokeratosis (DSAP): Secondary | ICD-10-CM | POA: Diagnosis not present

## 2023-03-12 DIAGNOSIS — D2272 Melanocytic nevi of left lower limb, including hip: Secondary | ICD-10-CM | POA: Diagnosis not present

## 2023-03-12 DIAGNOSIS — D225 Melanocytic nevi of trunk: Secondary | ICD-10-CM | POA: Diagnosis not present

## 2023-04-30 ENCOUNTER — Ambulatory Visit
Admission: RE | Admit: 2023-04-30 | Discharge: 2023-04-30 | Disposition: A | Discharge status: Home / Self Care | Payer: BC Managed Care – PPO | Source: Ambulatory Visit | Attending: Family Medicine | Admitting: Family Medicine

## 2023-04-30 DIAGNOSIS — Z1231 Encounter for screening mammogram for malignant neoplasm of breast: Secondary | ICD-10-CM | POA: Diagnosis not present

## 2023-05-02 NOTE — Progress Notes (Signed)
Hi Makaleigh  Normal mammogram; repeat in 1 year.  Please let us know if you have any questions.  Thank you,  Merita Norton, FNP

## 2023-08-15 DIAGNOSIS — R2689 Other abnormalities of gait and mobility: Secondary | ICD-10-CM | POA: Diagnosis not present

## 2023-08-20 ENCOUNTER — Ambulatory Visit (INDEPENDENT_AMBULATORY_CARE_PROVIDER_SITE_OTHER): Payer: PPO | Admitting: Family Medicine

## 2023-08-20 ENCOUNTER — Telehealth: Payer: Self-pay

## 2023-08-20 ENCOUNTER — Encounter: Payer: Self-pay | Admitting: Family Medicine

## 2023-08-20 VITALS — BP 132/81 | HR 64 | Ht 65.0 in | Wt 121.5 lb

## 2023-08-20 DIAGNOSIS — I10 Essential (primary) hypertension: Secondary | ICD-10-CM

## 2023-08-20 DIAGNOSIS — E7800 Pure hypercholesterolemia, unspecified: Secondary | ICD-10-CM | POA: Diagnosis not present

## 2023-08-20 DIAGNOSIS — R7303 Prediabetes: Secondary | ICD-10-CM | POA: Diagnosis not present

## 2023-08-20 DIAGNOSIS — R82998 Other abnormal findings in urine: Secondary | ICD-10-CM | POA: Diagnosis not present

## 2023-08-20 DIAGNOSIS — R42 Dizziness and giddiness: Secondary | ICD-10-CM | POA: Diagnosis not present

## 2023-08-20 MED ORDER — LISINOPRIL 10 MG PO TABS: 10.0000 mg | ORAL_TABLET | Freq: Every day | ORAL | 0 refills | Status: DC

## 2023-08-20 NOTE — Telephone Encounter (Signed)
Copied from CRM [id number]. Topic: General - Inquiry >> Aug 20, 2023  1:57 PM Madison Reese wrote: Patient has called back, returning Leamon Arnt call, please advise.  Patients callback # [phone]

## 2023-08-20 NOTE — Assessment & Plan Note (Signed)
Noted at [facility] appt for dizziness; no culture; no symptoms  Recommend repeat UA/Ucx given concern for lack of symptoms iso aging female

## 2023-08-20 NOTE — Assessment & Plan Note (Signed)
Chronic, increased with symptoms of dizziness and eye strain Recommend start of lisinopril 10 mg given elevated BP and symptoms; BP has been borderline elevated for years 1 month f/u recommended CBC and CMP completed with TSH and ESR at [facility]

## 2023-08-20 NOTE — Progress Notes (Signed)
Established patient visit   Patient: Madison Reese   DOB: 12/20/1955   67 y.o. Female  MRN: [id number] Visit Date: 08/20/2023  Today's healthcare provider: Jacky Kindle, FNP  Introduced to nurse practitioner role and practice setting.  All questions answered.  Discussed provider/patient relationship and expectations.  Subjective    HPI HPI   Patient is present due to balance and BP concerns X last Wednesday while she was at work. Reports she was feeling better by Saturday but Sunday she woke up feeling bad. Reports going to walk in at [facility]. Last edited by Acey Lav, CMA on 08/20/2023  8:24 AM.       Objective    BP 132/81 (BP Location: Right Arm, Patient Position: Sitting, Cuff Size: Normal)   Pulse 64   Ht 5\' 5"  (1.651 m)   Wt 121 lb 8 oz (55.1 kg)   SpO2 100%   BMI 20.22 kg/m   Physical Exam Vitals and nursing note reviewed.  Constitutional:      General: She is not in acute distress.    Appearance: Normal appearance. She is normal weight. She is not ill-appearing, toxic-appearing or diaphoretic.  HENT:     Head: Normocephalic and atraumatic.  Eyes:     Comments: Complaints of eye fatigue; last vision 12/23; pt denies vision concerns. Noted BP has been elevated   Neck:     Vascular: No carotid bruit.  Cardiovascular:     Rate and Rhythm: Normal rate and regular rhythm.     Pulses: Normal pulses.     Heart sounds: Normal heart sounds. No murmur heard.    No friction rub. No gallop.  Pulmonary:     Effort: Pulmonary effort is normal. No respiratory distress.     Breath sounds: Normal breath sounds. No stridor. No wheezing, rhonchi or rales.  Chest:     Chest wall: No tenderness.  Abdominal:     General: Bowel sounds are normal.     Palpations: Abdomen is soft.  Musculoskeletal:        General: No swelling, tenderness, deformity or signs of injury. Normal range of motion.     Cervical back: Normal range of motion and neck supple. No rigidity  or tenderness.     Right lower leg: No edema.     Left lower leg: No edema.  Lymphadenopathy:     Cervical: No cervical adenopathy.  Skin:    General: Skin is warm and dry.     Capillary Refill: Capillary refill takes less than 2 seconds.     Coloration: Skin is not jaundiced or pale.     Findings: No bruising, erythema, lesion or rash.  Neurological:     General: No focal deficit present.     Mental Status: She is alert and oriented to person, place, and time. Mental status is at baseline.     Cranial Nerves: No cranial nerve deficit.     Sensory: No sensory deficit.     Motor: No weakness.     Coordination: Coordination normal.     Comments: No weakness or loss of strength; good balance, no concerns for wobbling or swaying. No assistive devices.  Psychiatric:        Mood and Affect: Mood normal.        Behavior: Behavior normal.        Thought Content: Thought content normal.        Judgment: Judgment normal.     No results found  for any visits on 08/20/23.  Assessment & Plan     Problem List Items Addressed This Visit       Cardiovascular and Mediastinum   Primary hypertension    Chronic, increased with symptoms of dizziness and eye strain Recommend start of lisinopril 10 mg given elevated BP and symptoms; BP has been borderline elevated for years 1 month f/u recommended CBC and CMP completed with TSH and ESR at [facility]      Relevant Medications   lisinopril (ZESTRIL) 10 MG tablet     Other   Dizziness - Primary    Acute self limiting; unclear cause Reports fatigue and eye strain; recommend additional vitamin workup and chronic disease workup for HLD and pre-diabetes Referral to neuro and carotid US       Relevant Orders   Lipid panel   Vitamin D (25 hydroxy)   Vitamin B1   B12 and Folate Panel   Vitamin B6   Hemoglobin A1c   VAS US CAROTID   Ambulatory referral to Neurology   Elevated LDL cholesterol level    Chronic, repeat labs The 10-year ASCVD risk  score (Arnett DK, et al., 2019) is: 8.5% Not on statin at this time given pre-diabetic/pt request       Leukocytes in urine    Noted at [facility] appt for dizziness; no culture; no symptoms  Recommend repeat UA/Ucx given concern for lack of symptoms iso aging female       Relevant Orders   Urine Culture   Urinalysis, Routine w reflex microscopic   Prediabetes    Chronic, controlled with diet/exercise Repeat A1c  Continue to recommend balanced, lower carb meals. Smaller meal size, adding snacks. Choosing water as drink of choice and increasing purposeful exercise.       Return in about 4 weeks (around 09/17/2023).     Leilani Merl, FNP, have reviewed all documentation for this visit. The documentation on 08/20/23 for the exam, diagnosis, procedures, and orders are all accurate and complete.  Jacky Kindle, FNP  [facility] [phone] (phone) [phone] (fax)  [facility]

## 2023-08-20 NOTE — Assessment & Plan Note (Signed)
Acute self limiting; unclear cause Reports fatigue and eye strain; recommend additional vitamin workup and chronic disease workup for HLD and pre-diabetes Referral to neuro and carotid US

## 2023-08-20 NOTE — Assessment & Plan Note (Signed)
Chronic, controlled with diet/exercise Repeat A1c Continue to recommend balanced, lower carb meals. Smaller meal size, adding snacks. Choosing water as drink of choice and increasing purposeful exercise.

## 2023-08-20 NOTE — Assessment & Plan Note (Signed)
Chronic, repeat labs The 10-year ASCVD risk score (Arnett DK, et al., 2019) is: 8.5% Not on statin at this time given pre-diabetic/pt request

## 2023-08-25 ENCOUNTER — Other Ambulatory Visit: Payer: Self-pay | Admitting: Family Medicine

## 2023-08-25 LAB — LIPID PANEL
Chol/HDL Ratio: 2.1 ratio (ref 0.0–4.4)
Cholesterol, Total: 223 mg/dL — ABNORMAL HIGH (ref 100–199)
HDL: 108 mg/dL (ref >39)
LDL Chol Calc (NIH): 107 mg/dL — ABNORMAL HIGH (ref 0–99)
Triglycerides: 46 mg/dL (ref 0–149)
VLDL Cholesterol Cal: 8 mg/dL (ref 5–40)

## 2023-08-25 LAB — HEMOGLOBIN A1C
Est. average glucose Bld gHb Est-mCnc: 120 mg/dL
Hgb A1c MFr Bld: 5.8 % — ABNORMAL HIGH (ref 4.8–5.6)

## 2023-08-25 LAB — URINALYSIS, ROUTINE W REFLEX MICROSCOPIC
Bilirubin, UA: NEGATIVE
Glucose, UA: NEGATIVE
Ketones, UA: NEGATIVE
Nitrite, UA: NEGATIVE
Protein,UA: NEGATIVE
RBC, UA: NEGATIVE
Specific Gravity, UA: 1.019 (ref 1.005–1.030)
Urobilinogen, Ur: 0.2 mg/dL (ref 0.2–1.0)
pH, UA: 6.5 (ref 5.0–7.5)

## 2023-08-25 LAB — MICROSCOPIC EXAMINATION
Bacteria, UA: NONE SEEN
Casts: NONE SEEN /LPF
RBC, Urine: NONE SEEN /HPF (ref 0–2)
WBC, UA: NONE SEEN /HPF (ref 0–5)

## 2023-08-25 LAB — B12 AND FOLATE PANEL
Folate: 18.5 ng/mL (ref >3.0)
Vitamin B-12: 1072 pg/mL (ref 232–1245)

## 2023-08-25 LAB — VITAMIN D 25 HYDROXY (VIT D DEFICIENCY, FRACTURES): Vit D, 25-Hydroxy: 88.8 ng/mL (ref 30.0–100.0)

## 2023-08-25 LAB — URINE CULTURE

## 2023-08-25 LAB — VITAMIN B1: Thiamine: 86.7 nmol/L (ref 66.5–200.0)

## 2023-08-25 LAB — VITAMIN B6: Vitamin B6: 19.6 ug/L (ref 3.4–65.2)

## 2023-08-25 MED ORDER — SULFAMETHOXAZOLE-TRIMETHOPRIM 800-160 MG PO TABS: 1.0000 | ORAL_TABLET | Freq: Two times a day (BID) | ORAL | 0 refills | Status: DC

## 2023-08-26 ENCOUNTER — Ambulatory Visit
Admission: RE | Admit: 2023-08-26 | Discharge: 2023-08-26 | Disposition: A | Discharge status: Home / Self Care | Payer: PPO | Source: Ambulatory Visit | Attending: Family Medicine | Admitting: Family Medicine

## 2023-08-26 DIAGNOSIS — R42 Dizziness and giddiness: Secondary | ICD-10-CM | POA: Diagnosis not present

## 2023-09-17 ENCOUNTER — Encounter: Payer: Self-pay | Admitting: Family Medicine

## 2023-09-17 ENCOUNTER — Ambulatory Visit (INDEPENDENT_AMBULATORY_CARE_PROVIDER_SITE_OTHER): Payer: PPO | Admitting: Family Medicine

## 2023-09-17 VITALS — BP 123/68 | HR 63 | Temp 98.1°F | Ht 64.0 in | Wt 119.6 lb

## 2023-09-17 DIAGNOSIS — R42 Dizziness and giddiness: Secondary | ICD-10-CM | POA: Diagnosis not present

## 2023-09-17 DIAGNOSIS — I10 Essential (primary) hypertension: Secondary | ICD-10-CM

## 2023-09-17 DIAGNOSIS — F419 Anxiety disorder, unspecified: Secondary | ICD-10-CM | POA: Diagnosis not present

## 2023-09-17 MED ORDER — LISINOPRIL 10 MG PO TABS
10.0000 mg | ORAL_TABLET | Freq: Every day | ORAL | 3 refills | Status: DC
Start: 2023-09-17 — End: 2024-08-28

## 2023-09-17 MED ORDER — PAROXETINE HCL 20 MG PO TABS
20.0000 mg | ORAL_TABLET | Freq: Every day | ORAL | 3 refills | Status: DC
Start: 2023-09-17 — End: 2024-08-28

## 2023-09-17 NOTE — Progress Notes (Signed)
Established patient visit   Patient: Madison Reese   DOB: 09-01-1956   67 y.o. Female  MRN: [id number] Visit Date: 09/17/2023  Today's healthcare provider: Jacky Kindle, FNP  Introduced to nurse practitioner role and practice setting.  All questions answered.  Discussed provider/patient relationship and expectations.  Subjective    HPI HPI     Medical Management of Chronic Issues    Additional comments: Follow up for dizziness and HTN, pt stated she's doing better       Last edited by Rolly Salter, CMA on 09/17/2023  8:16 AM.     The patient, with a history of hypertension and anxiety, presents for follow-up after an urgent care visit. They report feeling better since the last visit. They had been experiencing dizziness, which was investigated with carotid imaging and labs. The carotid imaging did not reveal significant blockage, and labs were unremarkable. The patient's blood pressure is now controlled and stable, likely resolving the dizziness.  The patient is currently on lisinopril for hypertension and paroxetine (Paxil) for anxiety. They had been taking 20mg  of paroxetine and considered increasing the dose to 40mg , but decided to continue with the 20mg  dose.  The patient also inquires about protein intake, expressing concern about muscle loss with age. They currently consume protein shakes and are considering other sources of protein. They also express concern about dark circles under their eyes, which they attribute to aging.  The patient is physically active, walking during lunch breaks and working out two days a week. They also have a stationary bicycle at home. They express frustration with the physical changes that come with aging, including muscle loss and skin changes.  Medications: Outpatient Medications Prior to Visit  Medication Sig   lisinopril (ZESTRIL) 10 MG tablet Take 1 tablet (10 mg total) by mouth daily.   PARoxetine (PAXIL) 20 MG tablet Take 1 tablet  (20 mg total) by mouth daily.   sulfamethoxazole-trimethoprim (BACTRIM DS) 800-160 MG tablet Take 1 tablet by mouth 2 (two) times daily.   No facility-administered medications prior to visit.     Objective    BP 123/68 (BP Location: Right Arm, Patient Position: Sitting, Cuff Size: Normal)   Pulse 63   Temp 98.1 F (36.7 C) (Oral)   Ht 5\' 4"  (1.626 m)   Wt 119 lb 9.6 oz (54.3 kg)   SpO2 100%   BMI 20.53 kg/m   Physical Exam Vitals and nursing note reviewed.  Constitutional:      General: She is not in acute distress.    Appearance: Normal appearance. She is normal weight. She is not ill-appearing, toxic-appearing or diaphoretic.  HENT:     Head: Normocephalic and atraumatic.  Cardiovascular:     Rate and Rhythm: Normal rate and regular rhythm.     Pulses: Normal pulses.     Heart sounds: Normal heart sounds. No murmur heard.    No friction rub. No gallop.  Pulmonary:     Effort: Pulmonary effort is normal. No respiratory distress.     Breath sounds: Normal breath sounds. No stridor. No wheezing, rhonchi or rales.  Chest:     Chest wall: No tenderness.  Musculoskeletal:        General: No swelling, tenderness, deformity or signs of injury. Normal range of motion.     Right lower leg: No edema.     Left lower leg: No edema.  Skin:    General: Skin is warm and dry.  Capillary Refill: Capillary refill takes less than 2 seconds.     Coloration: Skin is not jaundiced or pale.     Findings: No bruising, erythema, lesion or rash.  Neurological:     General: No focal deficit present.     Mental Status: She is alert and oriented to person, place, and time. Mental status is at baseline.     Cranial Nerves: No cranial nerve deficit.     Sensory: No sensory deficit.     Motor: No weakness.     Coordination: Coordination normal.  Psychiatric:        Mood and Affect: Mood normal.        Behavior: Behavior normal.        Thought Content: Thought content normal.         Judgment: Judgment normal.     No results found for any visits on 09/17/23.  Assessment & Plan    Hypertension Improved blood pressure control since last visit, likely contributing to resolution of dizziness. Currently on Lisinopril. -Continue Lisinopril, refill for 90 days  Anxiety Stable on Paroxetine (Paxil) 20mg , patient considered increasing dose but decided against it. -Refill Paroxetine (Paxil) prescription.  Nutrition/Protein Intake Patient concerned about adequate protein intake and muscle loss with aging. -Recommended intake of approximately 60 grams of protein per day, from varied sources.  Exercise Patient currently exercises two days a week and has resumed walking during lunch breaks. -Encouraged continuation of current exercise regimen and consideration of indoor exercise options for colder months.  Follow-up Patient has an upcoming appointment with Dr. Clelia Croft to discuss dizziness, but is considering cancelling as symptoms have resolved.  Schedule physical for after December 21st.      I, Jacky Kindle, FNP, have reviewed all documentation for this visit. The documentation on 09/17/23 for the exam, diagnosis, procedures, and orders are all accurate and complete.  Jacky Kindle, FNP  [facility] [phone] (phone) [phone] (fax)  [facility]

## 2023-12-02 ENCOUNTER — Encounter: Payer: Self-pay | Admitting: Family Medicine

## 2023-12-02 ENCOUNTER — Ambulatory Visit: Payer: PPO | Admitting: Family Medicine

## 2023-12-02 ENCOUNTER — Telehealth: Payer: Self-pay | Admitting: Family Medicine

## 2023-12-02 ENCOUNTER — Other Ambulatory Visit: Payer: Self-pay

## 2023-12-02 VITALS — Ht 64.0 in | Wt 121.0 lb

## 2023-12-02 DIAGNOSIS — Z0000 Encounter for general adult medical examination without abnormal findings: Secondary | ICD-10-CM | POA: Diagnosis not present

## 2023-12-02 DIAGNOSIS — Z23 Encounter for immunization: Secondary | ICD-10-CM | POA: Diagnosis not present

## 2023-12-02 MED ORDER — ESTRADIOL 0.1 MG/GM VA CREA: 1.0000 | TOPICAL_CREAM | VAGINAL | 0 refills | Status: DC

## 2023-12-02 MED ORDER — ALPRAZOLAM 0.5 MG PO TABS: 0.5000 mg | ORAL_TABLET | Freq: Three times a day (TID) | ORAL | 0 refills | Status: DC | PRN

## 2023-12-02 NOTE — Progress Notes (Signed)
Annual Wellness Visit     Patient: Madison Reese, Female    DOB: 1956/04/19, 67 y.o.   MRN: [id number] Visit Date: 12/02/2023  Today's Provider: Jacky Kindle, FNP  Introduced to nurse practitioner role and practice setting.  All questions answered.  Discussed provider/patient relationship and expectations.  Chief Complaint  Patient presents with   Medicare Wellness   Subjective    Madison Reese is a 67 y.o. female who presents today for her Annual Wellness Visit. She reports consuming a general diet. Gym/ health club routine includes low impact aerobics. She generally feels fairly well. She reports sleeping fairly well. She does not have additional problems to discuss today.   HPI  Presents for wellness visit- vision and EKG completed. Has cologuard kit at home.  Medications: Outpatient Medications Prior to Visit  Medication Sig   lisinopril (ZESTRIL) 10 MG tablet Take 1 tablet (10 mg total) by mouth daily.   PARoxetine (PAXIL) 20 MG tablet Take 1 tablet (20 mg total) by mouth daily.   [DISCONTINUED] estradiol (ESTRACE) 0.1 MG/GM vaginal cream APPLY 1 APPLICATORFUL VAGINALLY AT BEDTIME   No facility-administered medications prior to visit.    No Known Allergies  Patient Care Team: Jacky Kindle, FNP as PCP - General (Family Medicine)  Last CBC Lab Results  Component Value Date   WBC 4.7 11/28/2022   HGB 13.7 11/28/2022   HCT 42.1 11/28/2022   MCV 84 11/28/2022   MCH 27.5 11/28/2022   RDW 13.2 11/28/2022   PLT 225 11/28/2022   Last metabolic panel Lab Results  Component Value Date   GLUCOSE 104 (H) 11/28/2022   NA 141 11/28/2022   K 5.1 11/28/2022   CL 97 11/28/2022   CO2 27 11/28/2022   BUN 17 11/28/2022   CREATININE 1.04 (H) 11/28/2022   EGFR 59 (L) 11/28/2022   CALCIUM 10.0 11/28/2022   PROT 6.9 11/28/2022   ALBUMIN 4.5 11/28/2022   LABGLOB 2.4 11/28/2022   AGRATIO 1.9 11/28/2022   BILITOT 0.3 11/28/2022   ALKPHOS 105 11/28/2022    AST 22 11/28/2022   ALT 15 11/28/2022   Last lipids Lab Results  Component Value Date   CHOL 223 (H) 08/20/2023   HDL 108 08/20/2023   LDLCALC 107 (H) 08/20/2023   TRIG 46 08/20/2023   CHOLHDL 2.1 08/20/2023   Last hemoglobin A1c Lab Results  Component Value Date   HGBA1C 5.8 (H) 08/20/2023   Last thyroid functions Lab Results  Component Value Date   TSH 3.010 11/28/2022   Last vitamin D Lab Results  Component Value Date   VD25OH 88.8 08/20/2023   Last vitamin B12 and Folate Lab Results  Component Value Date   VITAMINB12 1,072 08/20/2023   FOLATE 18.5 08/20/2023     Objective    Vitals: Ht 5\' 4"  (1.626 m)   Wt 121 lb (54.9 kg)   BMI 20.77 kg/m   BP Readings from Last 3 Encounters:  09/17/23 123/68  08/20/23 132/81  11/28/22 122/86   Wt Readings from Last 3 Encounters:  12/02/23 121 lb (54.9 kg)  09/17/23 119 lb 9.6 oz (54.3 kg)  08/20/23 121 lb 8 oz (55.1 kg)   SpO2 Readings from Last 3 Encounters:  09/17/23 100%  08/20/23 100%  11/28/22 100%   Physical Exam Vitals and nursing note reviewed.  Constitutional:      General: She is not in acute distress.    Appearance: Normal appearance. She is normal weight. She  is not ill-appearing, toxic-appearing or diaphoretic.  HENT:     Head: Normocephalic and atraumatic.  Cardiovascular:     Rate and Rhythm: Normal rate and regular rhythm.     Pulses: Normal pulses.     Heart sounds: Normal heart sounds. No murmur heard.    No friction rub. No gallop.  Pulmonary:     Effort: Pulmonary effort is normal. No respiratory distress.     Breath sounds: Normal breath sounds. No stridor. No wheezing, rhonchi or rales.  Chest:     Chest wall: No tenderness.  Musculoskeletal:        General: No swelling, tenderness, deformity or signs of injury. Normal range of motion.     Right lower leg: No edema.     Left lower leg: No edema.  Skin:    General: Skin is warm and dry.     Capillary Refill: Capillary refill  takes less than 2 seconds.     Coloration: Skin is not jaundiced or pale.     Findings: No bruising, erythema, lesion or rash.  Neurological:     General: No focal deficit present.     Mental Status: She is alert and oriented to person, place, and time. Mental status is at baseline.     Cranial Nerves: No cranial nerve deficit.     Sensory: No sensory deficit.     Motor: No weakness.     Coordination: Coordination normal.  Psychiatric:        Mood and Affect: Mood normal.        Behavior: Behavior normal.        Thought Content: Thought content normal.        Judgment: Judgment normal.    Most recent functional status assessment:     No data to display         Most recent fall risk assessment:    12/02/2023    9:17 AM  Fall Risk   Falls in the past year? 0  Number falls in past yr: 0  Injury with Fall? 0    Most recent depression screenings:    12/02/2023    9:17 AM 09/17/2023    8:20 AM  PHQ 2/9 Scores  PHQ - 2 Score 0 0  PHQ- 9 Score  0   Most recent cognitive screening:    12/02/2023    9:17 AM  6CIT Screen  What Year? 0 points  What month? 0 points  What time? 0 points  Count back from 20 0 points  Months in reverse 0 points  Repeat phrase 0 points  Total Score 0 points   Most recent Audit-C alcohol use screening    11/28/2022    8:49 AM  Alcohol Use Disorder Test (AUDIT)  1. How often do you have a drink containing alcohol? 0  2. How many drinks containing alcohol do you have on a typical day when you are drinking? 0  3. How often do you have six or more drinks on one occasion? 0  AUDIT-C Score 0  Alcohol Brief Interventions/Follow-up Patient Refused   A score of 3 or more in women, and 4 or more in men indicates increased risk for alcohol abuse, EXCEPT if all of the points are from question 1   No results found for any visits on 12/02/23.  Assessment & Plan     Annual wellness visit done today including the all of the following: Reviewed  patient's Family Medical History Reviewed and updated list  of patient's medical providers Assessment of cognitive impairment was done Assessed patient's functional ability Established a written schedule for health screening services Health Risk Assessent Completed and Reviewed  Exercise Activities and Dietary recommendations  Goals   None     Immunization History  Administered Date(s) Administered   Fluad Quad(high Dose 65+) 09/21/2022   PNEUMOCOCCAL CONJUGATE-20 12/02/2023   Td 11/24/2018   Tdap 10/01/2007    Health Maintenance  Topic Date Due   Zoster Vaccines- Shingrix (1 of 2) Never done   Colonoscopy  11/25/2017   COVID-19 Vaccine (1 - 2024-25 season) Never done   INFLUENZA VACCINE  03/09/2024 (Originally 07/11/2023)   Medicare Annual Wellness (AWV)  12/06/2024   MAMMOGRAM  04/29/2025   DTaP/Tdap/Td (3 - Td or Tdap) 11/24/2028   Pneumonia Vaccine 62+ Years old  Completed   DEXA SCAN  Completed   Hepatitis C Screening  Completed   HPV VACCINES  Aged Out     Discussed health benefits of physical activity, and encouraged her to engage in regular exercise appropriate for her age and condition.    Problem List Items Addressed This Visit       Other   Need for vaccination against Streptococcus pneumoniae   Relevant Orders   Pneumococcal conjugate vaccine 20-valent (Prevnar 20) (Completed)   Welcome to Medicare preventive visit - Primary   Things to do to keep yourself healthy  - Exercise at least 30-45 minutes a day, 3-4 days a week.  - Eat a low-fat diet with lots of fruits and vegetables, up to 7-9 servings per day.  - Seatbelts can save your life. Wear them always.  - Smoke detectors on every level of your home, check batteries every year.  - Eye Doctor - have an eye exam every 1-2 years  - Safe sex - if you may be exposed to STDs, use a condom.  - Alcohol -  If you drink, do it moderately, less than 2 drinks per day.  - Health Care Power of Attorney. Choose  someone to speak for you if you are not able.  - Depression is common in our stressful world.If you're feeling down or losing interest in things you normally enjoy, please come in for a visit.  - Violence - If anyone is threatening or hurting you, please call immediately.  EKG obtained; normal SR without comparison. No ectopy.      Relevant Orders   EKG 12-Lead   Return in about 6 months (around 06/01/2024) for annual examination- with new PCP.    Leilani Merl, FNP, have reviewed all documentation for this visit. The documentation on 12/07/23 for the exam, diagnosis, procedures, and orders are all accurate and complete.  Jacky Kindle, FNP  [facility] [phone] (phone) [phone] (fax)  [facility]

## 2023-12-02 NOTE — Patient Instructions (Signed)
Request to change to Dr B- sister, Lucretia Field sees Dr B.

## 2023-12-02 NOTE — Telephone Encounter (Signed)
Patient wanted to know if Dr. B would take her as a patient since Robynn Pane is leaving.

## 2023-12-07 DIAGNOSIS — Z0000 Encounter for general adult medical examination without abnormal findings: Secondary | ICD-10-CM | POA: Insufficient documentation

## 2023-12-07 DIAGNOSIS — Z23 Encounter for immunization: Secondary | ICD-10-CM | POA: Insufficient documentation

## 2023-12-07 NOTE — Assessment & Plan Note (Signed)

## 2023-12-09 NOTE — Telephone Encounter (Signed)
Recommend that patient establish with one of the providers accepting new patients currently - Dr Roxan Hockey, Dr Payton Mccallum, Edmon Crape, and [place].

## 2023-12-13 ENCOUNTER — Other Ambulatory Visit: Payer: Self-pay | Admitting: Family Medicine

## 2023-12-13 DIAGNOSIS — Z8619 Personal history of other infectious and parasitic diseases: Secondary | ICD-10-CM

## 2023-12-13 NOTE — Telephone Encounter (Signed)
**Note De-identified  [place] Obfuscation** Please advise 

## 2024-03-11 ENCOUNTER — Other Ambulatory Visit: Payer: Self-pay | Admitting: Family Medicine

## 2024-03-11 NOTE — Telephone Encounter (Signed)
 [facility] is requesting refill estradiol (ESTRACE) 0.1 MG/GM vaginal cream   Please advise

## 2024-03-17 MED ORDER — ESTRADIOL 0.1 MG/GM VA CREA: 1.0000 | TOPICAL_CREAM | VAGINAL | 0 refills | Status: DC

## 2024-06-24 ENCOUNTER — Other Ambulatory Visit: Payer: Self-pay | Admitting: Family Medicine

## 2024-07-17 ENCOUNTER — Telehealth: Payer: Self-pay

## 2024-07-17 DIAGNOSIS — Z1231 Encounter for screening mammogram for malignant neoplasm of breast: Secondary | ICD-10-CM

## 2024-07-17 NOTE — Telephone Encounter (Signed)
 Copied from CRM #[id number]. Topic: General - Other >> Jul 17, 2024  4:11 PM Chasity T wrote: Reason for CRM: Patient is calling to request for us  to call [facility] to let them know its okay for her to get a mammogram. [phone]

## 2024-07-22 NOTE — Telephone Encounter (Signed)
 Left message on patient's home # to call back to schedule an appt with Dr. Donzella. She needs appt before any orders can be place.d

## 2024-08-21 ENCOUNTER — Telehealth: Payer: Self-pay

## 2024-08-21 NOTE — Telephone Encounter (Signed)
 Copied from CRM #[id number]. Topic: General - Other >> Aug 21, 2024  2:03 PM Jasmin G wrote: Reason for CRM: Pt called regarding recent missed phone call, I could not find any info to relay, please call pt back at [phone] if needed. It's okay to leave a voicemail.

## 2024-08-21 NOTE — Telephone Encounter (Signed)
 Returned patients call and after further review of chart looks like missed call was just for her appointment reminder on 08/18/24 with Lauraine Buoy, DO.  Ok to advise if patient returns call

## 2024-08-28 ENCOUNTER — Encounter: Payer: Self-pay | Admitting: Family Medicine

## 2024-08-28 ENCOUNTER — Ambulatory Visit: Admitting: Family Medicine

## 2024-08-28 VITALS — BP 111/71 | HR 63 | Temp 97.9°F | Ht 64.0 in | Wt 122.9 lb

## 2024-08-28 DIAGNOSIS — I10 Essential (primary) hypertension: Secondary | ICD-10-CM

## 2024-08-28 DIAGNOSIS — N952 Postmenopausal atrophic vaginitis: Secondary | ICD-10-CM

## 2024-08-28 DIAGNOSIS — Z0001 Encounter for general adult medical examination with abnormal findings: Secondary | ICD-10-CM | POA: Diagnosis not present

## 2024-08-28 DIAGNOSIS — E7800 Pure hypercholesterolemia, unspecified: Secondary | ICD-10-CM

## 2024-08-28 DIAGNOSIS — Z1231 Encounter for screening mammogram for malignant neoplasm of breast: Secondary | ICD-10-CM

## 2024-08-28 DIAGNOSIS — K5909 Other constipation: Secondary | ICD-10-CM

## 2024-08-28 DIAGNOSIS — N182 Chronic kidney disease, stage 2 (mild): Secondary | ICD-10-CM

## 2024-08-28 DIAGNOSIS — Z0000 Encounter for general adult medical examination without abnormal findings: Secondary | ICD-10-CM

## 2024-08-28 DIAGNOSIS — F419 Anxiety disorder, unspecified: Secondary | ICD-10-CM

## 2024-08-28 DIAGNOSIS — R7303 Prediabetes: Secondary | ICD-10-CM

## 2024-08-28 DIAGNOSIS — Z1211 Encounter for screening for malignant neoplasm of colon: Secondary | ICD-10-CM

## 2024-08-28 MED ORDER — ESTRADIOL 0.1 MG/GM VA CREA: 1.0000 | TOPICAL_CREAM | VAGINAL | 0 refills | Status: DC

## 2024-08-28 MED ORDER — ALPRAZOLAM 0.5 MG PO TABS: 0.5000 mg | ORAL_TABLET | Freq: Three times a day (TID) | ORAL | 0 refills | Status: AC | PRN

## 2024-08-28 MED ORDER — LISINOPRIL 10 MG PO TABS: 10.0000 mg | ORAL_TABLET | Freq: Every day | ORAL | 3 refills | Status: AC

## 2024-08-28 MED ORDER — PAROXETINE HCL 20 MG PO TABS: 20.0000 mg | ORAL_TABLET | Freq: Every day | ORAL | 3 refills | Status: AC

## 2024-08-28 NOTE — Progress Notes (Signed)
 Established patient visit   Patient: Madison Reese   DOB: 01/13/1956   68 y.o. Female  MRN: [id number] Visit Date: 08/28/2024  Today's healthcare provider: LAURAINE LOISE BUOY, DO   Chief Complaint  Patient presents with   Transitions Of Care    -Wants to establish with a primary provider was a patient of Kelly Cedar. -Wants to get a mammogram referral and cologuard sent to her home.  Already put orders in.   Flu Vaccine wants to wait til later in the year.   Subjective    HPI Madison Reese is a 68 year old female who presents for a routine follow-up visit.  She is due for a mammogram and has been using Cologuard for colon cancer screening. She has not had a colonoscopy in over ten years, with previous colonoscopies showing no polyps. She has experienced issues with Cologuard kits expiring before use.  She is currently taking paroxetine  20 mg daily and is stable on this medication without needing dose adjustments. Occasionally, she takes two pills if needed. She also takes lisinopril  for blood pressure management, which is well-controlled.  She uses estradiol  cream for vaginal dryness, which causes discomfort during intercourse. She uses it less frequently than prescribed, sometimes only once a week.  She has a prescription for Xanax , which she uses infrequently for anxiety flare-ups. She has a supply of 90 pills and feels comfortable with this amount as she rarely needs to take it.  She reports having a couple pills left at this point.  She takes Valacyclovir  as needed for outbreaks and currently does not require a refill.  She experiences constipation, having bowel movements about once a week. She has tried over-the-counter fiber pills and stool softeners without success. She drinks about 80 ounces of water daily but acknowledges a low fiber intake. She exercises twice a week and walks during lunch breaks at work. She consumes one cup of black coffee daily and has a low  intake of fruits and vegetables.  No history of thyroid  problems. Her kidney function has been slightly low in the past.  Reports constipation and infrequent bowel movements (once per week).      Medications: Outpatient Medications Prior to Visit  Medication Sig   valACYclovir  (VALTREX ) 500 MG tablet Take 1 tablet by mouth once daily   [DISCONTINUED] ALPRAZolam  (XANAX ) 0.5 MG tablet Take 1 tablet (0.5 mg total) by mouth 3 (three) times daily as needed for anxiety.   [DISCONTINUED] estradiol  (ESTRACE ) 0.1 MG/GM vaginal cream Place 1 Applicatorful vaginally 3 (three) times a week. NEED APPOINTMENT for additional refills.   [DISCONTINUED] lisinopril  (ZESTRIL ) 10 MG tablet Take 1 tablet (10 mg total) by mouth daily.   [DISCONTINUED] PARoxetine  (PAXIL ) 20 MG tablet Take 1 tablet (20 mg total) by mouth daily.   No facility-administered medications prior to visit.    Review of Systems  Constitutional:  Negative for appetite change, chills, fatigue and fever.  HENT:  Negative for congestion, ear pain, rhinorrhea, sneezing and sore throat.   Eyes: Negative.  Negative for pain and redness.  Respiratory:  Negative for cough, chest tightness, shortness of breath and wheezing.   Cardiovascular:  Negative for chest pain, palpitations and leg swelling.  Gastrointestinal:  Positive for constipation. Negative for abdominal pain, blood in stool, diarrhea, nausea and vomiting.  Endocrine: Negative for polydipsia and polyphagia.  Genitourinary: Negative.  Negative for dysuria, flank pain, hematuria, pelvic pain, vaginal bleeding and vaginal discharge.  Musculoskeletal:  Negative  for arthralgias, back pain, gait problem and joint swelling.  Skin:  Negative for rash.  Neurological: Negative.  Negative for dizziness, tremors, seizures, weakness, light-headedness, numbness and headaches.  Hematological:  Negative for adenopathy.  Psychiatric/Behavioral: Negative.  Negative for behavioral problems, confusion  and dysphoric mood. The patient is not nervous/anxious and is not hyperactive.         Objective    BP 111/71 (BP Location: Right Arm, Patient Position: Sitting, Cuff Size: Normal)   Pulse 63   Temp 97.9 F (36.6 C) (Oral)   Ht 5' 4 (1.626 m)   Wt 122 lb 14.4 oz (55.7 kg)   SpO2 100%   BMI 21.10 kg/m     Physical Exam Vitals and nursing note reviewed.  Constitutional:      General: She is awake.     Appearance: Normal appearance.  HENT:     Head: Normocephalic and atraumatic.     Right Ear: Tympanic membrane, ear canal and external ear normal.     Left Ear: Tympanic membrane, ear canal and external ear normal.     Nose: Nose normal.     Mouth/Throat:     Mouth: Mucous membranes are moist.     Pharynx: Oropharynx is clear. No oropharyngeal exudate or posterior oropharyngeal erythema.  Eyes:     General: No scleral icterus.    Extraocular Movements: Extraocular movements intact.     Conjunctiva/sclera: Conjunctivae normal.     Pupils: Pupils are equal, round, and reactive to light.  Neck:     Thyroid : No thyromegaly or thyroid  tenderness.  Cardiovascular:     Rate and Rhythm: Normal rate and regular rhythm.     Pulses: Normal pulses.     Heart sounds: Normal heart sounds.  Pulmonary:     Effort: Pulmonary effort is normal. No tachypnea, bradypnea or respiratory distress.     Breath sounds: Normal breath sounds. No stridor. No wheezing, rhonchi or rales.  Abdominal:     General: Bowel sounds are normal. There is no distension.     Palpations: Abdomen is soft. There is no mass.     Tenderness: There is no abdominal tenderness. There is no guarding.     Hernia: No hernia is present.  Musculoskeletal:     Cervical back: Normal range of motion and neck supple.     Right lower leg: No edema.     Left lower leg: No edema.  Lymphadenopathy:     Cervical: No cervical adenopathy.  Skin:    General: Skin is warm and dry.  Neurological:     Mental Status: She is alert  and oriented to person, place, and time. Mental status is at baseline.  Psychiatric:        Mood and Affect: Mood normal.        Behavior: Behavior normal.      No results found for any visits on 08/28/24.  Assessment & Plan    Annual physical exam  Primary hypertension -     Lisinopril ; Take 1 tablet (10 mg total) by mouth daily.  Dispense: 90 tablet; Refill: 3  Elevated LDL cholesterol level -     Lipid panel  Anxiety -     PARoxetine  HCl; Take 1 tablet (20 mg total) by mouth daily.  Dispense: 90 tablet; Refill: 3 -     ALPRAZolam ; Take 1 tablet (0.5 mg total) by mouth 3 (three) times daily as needed for anxiety.  Dispense: 90 tablet; Refill: 0  Chronic kidney  disease, stage 2, mildly decreased GFR -     Microalbumin / creatinine urine ratio -     Comprehensive metabolic panel with GFR  Chronic constipation  Post-menopausal atrophic vaginitis -     Estradiol ; Place 1 Applicatorful vaginally 3 (three) times a week. NEED APPOINTMENT for additional refills.  Dispense: 43 g; Refill: 0  Prediabetes -     Hemoglobin A1c  Encounter for colorectal cancer screening -     Cologuard  Encounter for screening mammogram for breast cancer      Annual physical exam Physical exam overall unremarkable except as noted above. Routine lab work ordered as noted.  Mammogram already ordered. Cologuard order sent today.  Defer flu shot to later in season due to patient preference.  Chronic constipation Chronic constipation with infrequent bowel movements due to inadequate dietary fiber intake and ineffective use of stool softeners. - Increase dietary fiber through fruits, vegetables, and beans. - Increase fluid intake. - Use Benefiber or Metamucil daily. - Consider MiraLAX if dietary changes are insufficient.  Major recurrent depressive disorder, in remission Well-managed on paroxetine  (Paxil ). - Continue current dose of paroxetine  (Paxil ).  Essential hypertension Chronic, stable.  Blood pressure well-controlled on lisinopril . - Continue current dose of lisinopril  10 mg daily.  Postmenopausal atrophic vaginitis Vaginal dryness and discomfort due to inconsistent estradiol  cream use. - Increase estradiol  cream to three times a week, then titrate down as symptoms improve. - Refill estradiol  cream prescription.  Anxiety disorder, unspecified Infrequent anxiety flare-ups managed with as-needed Xanax  and daily paroxetine .  No changes. - Continue current prescription of Xanax  for as-needed use.  Chronic kidney disease, stage 2 Slightly reduced kidney function with lisinopril  providing kidney protection. - Continue lisinopril  for kidney protection. - Check kidney function with blood work and uACR.  Prediabetes Prediabetes with lisinopril  providing kidney protection. Check A1c today.    Return in about 1 year (around 08/28/2025) for CPE, and mAWV in 3 months with AWV nurse.      I discussed the assessment and treatment plan with the patient  The patient was provided an opportunity to ask questions and all were answered. The patient agreed with the plan and demonstrated an understanding of the instructions.   The patient was advised to call back or seek an in-person evaluation if the symptoms worsen or if the condition fails to improve as anticipated.    LAURAINE LOISE BUOY, DO  [facility] [phone] (phone) [phone] (fax)  [facility]

## 2024-08-28 NOTE — Patient Instructions (Addendum)
 Recommended vaccines: Shingrix (shingles).  Can also try benefiber or metamucil daily to increase fiber intake.  Can try miralax daily to help move stool through the bowels.

## 2024-08-29 LAB — COMPREHENSIVE METABOLIC PANEL WITH GFR
ALT: 16 IU/L (ref 0–32)
AST: 26 IU/L (ref 0–40)
Albumin: 4.5 g/dL (ref 3.9–4.9)
Alkaline Phosphatase: 81 IU/L (ref 49–135)
BUN/Creatinine Ratio: 27 (ref 12–28)
BUN: 29 mg/dL — ABNORMAL HIGH (ref 8–27)
Bilirubin Total: 0.3 mg/dL (ref 0.0–1.2)
CO2: 22 mmol/L (ref 20–29)
Calcium: 10.1 mg/dL (ref 8.7–10.3)
Chloride: 101 mmol/L (ref 96–106)
Creatinine, Ser: 1.08 mg/dL — ABNORMAL HIGH (ref 0.57–1.00)
Globulin, Total: 2.5 g/dL (ref 1.5–4.5)
Glucose: 87 mg/dL (ref 70–99)
Potassium: 4.7 mmol/L (ref 3.5–5.2)
Sodium: 140 mmol/L (ref 134–144)
Total Protein: 7 g/dL (ref 6.0–8.5)
eGFR: 56 mL/min/1.73 — ABNORMAL LOW (ref >59)

## 2024-08-29 LAB — LIPID PANEL
Chol/HDL Ratio: 2 ratio (ref 0.0–4.4)
Cholesterol, Total: 214 mg/dL — ABNORMAL HIGH (ref 100–199)
HDL: 108 mg/dL (ref >39)
LDL Chol Calc (NIH): 100 mg/dL — ABNORMAL HIGH (ref 0–99)
Triglycerides: 31 mg/dL (ref 0–149)
VLDL Cholesterol Cal: 6 mg/dL (ref 5–40)

## 2024-08-29 LAB — HEMOGLOBIN A1C
Est. average glucose Bld gHb Est-mCnc: 120 mg/dL
Hgb A1c MFr Bld: 5.8 % — ABNORMAL HIGH (ref 4.8–5.6)

## 2024-08-29 LAB — MICROALBUMIN / CREATININE URINE RATIO
Creatinine, Urine: 15.6 mg/dL
Microalb/Creat Ratio: <19 mg/g{creat} (ref 0–29)
Microalbumin, Urine: <3 ug/mL

## 2024-09-14 ENCOUNTER — Ambulatory Visit: Payer: Self-pay | Admitting: Family Medicine

## 2024-10-20 ENCOUNTER — Ambulatory Visit
Admission: RE | Admit: 2024-10-20 | Discharge: 2024-10-20 | Disposition: A | Discharge status: Home / Self Care | Source: Ambulatory Visit | Attending: Family Medicine | Admitting: Family Medicine

## 2024-10-20 DIAGNOSIS — Z1231 Encounter for screening mammogram for malignant neoplasm of breast: Secondary | ICD-10-CM | POA: Diagnosis present

## 2024-10-24 ENCOUNTER — Ambulatory Visit: Payer: Self-pay | Admitting: Family Medicine

## 2024-11-06 ENCOUNTER — Telehealth: Payer: Self-pay

## 2024-11-27 ENCOUNTER — Ambulatory Visit (INDEPENDENT_AMBULATORY_CARE_PROVIDER_SITE_OTHER): Admitting: Family Medicine

## 2024-11-27 ENCOUNTER — Encounter: Payer: Self-pay | Admitting: Family Medicine

## 2024-11-27 VITALS — BP 113/62 | HR 68 | Temp 98.1°F | Ht 64.0 in | Wt 123.8 lb

## 2024-11-27 DIAGNOSIS — Z1211 Encounter for screening for malignant neoplasm of colon: Secondary | ICD-10-CM

## 2024-11-27 DIAGNOSIS — Z8619 Personal history of other infectious and parasitic diseases: Secondary | ICD-10-CM

## 2024-11-27 DIAGNOSIS — I10 Essential (primary) hypertension: Secondary | ICD-10-CM | POA: Diagnosis not present

## 2024-11-27 DIAGNOSIS — Z1212 Encounter for screening for malignant neoplasm of rectum: Secondary | ICD-10-CM | POA: Diagnosis not present

## 2024-11-27 DIAGNOSIS — F520 Hypoactive sexual desire disorder: Secondary | ICD-10-CM | POA: Diagnosis not present

## 2024-11-27 DIAGNOSIS — Z23 Encounter for immunization: Secondary | ICD-10-CM | POA: Diagnosis not present

## 2024-11-27 DIAGNOSIS — N952 Postmenopausal atrophic vaginitis: Secondary | ICD-10-CM | POA: Diagnosis not present

## 2024-11-27 MED ORDER — VALACYCLOVIR HCL 500 MG PO TABS: 500.0000 mg | ORAL_TABLET | Freq: Every day | ORAL | 0 refills | Status: AC

## 2024-11-27 MED ORDER — ESTRADIOL 0.01 % VA CREA: 1.0000 | TOPICAL_CREAM | VAGINAL | 12 refills | Status: AC

## 2024-11-27 NOTE — Assessment & Plan Note (Signed)
 Chronic, stable on lisinopril  10 mg daily. No changes today.

## 2024-11-27 NOTE — Patient Instructions (Signed)
 Recommended vaccine to get at Pharmacy: Tdap (tetanus, diphtheria and pertussis)

## 2024-11-27 NOTE — Progress Notes (Signed)
 "     Established patient visit   Patient: Madison Reese   DOB: [birth year]   68 y.o. Female  MRN: [id number] Visit Date: 11/27/2024  Today's healthcare provider: LAURAINE LOISE BUOY, DO   Chief Complaint  Patient presents with   Follow-up    Patient states that she is here due to a Health Maintenance letter that she received from the office.  Has the cologuard but still have not used it wants to know if she should just get a colonoscopy.  I did let her know that if the cologuard is positive she would have to do the colonoscopy.  Also she would like to get a flu shot.   Subjective    HPI Last annual exam: 08/28/2024   Madison Reese is a 68 year old female who presents for a flu shot and discussion about colonoscopy and shingles vaccine.  She is considering a colonoscopy despite her dislike for the preparation involved, as she has been postponing it for some time. There is no family history of colon cancer.  She is also contemplating the shingles vaccine and has heard mixed experiences regarding the side effects, particularly after the second shot.  She is currently prescribed Xanax  but has not needed to use it since her last visit. She appreciates having it available if necessary. She uses estradiol  cream for menopausal symptoms, specifically vaginal dryness, and reports needing additional lubricant during intercourse. She uses the cream approximately three times a week, which lasts her about a month per tube.  She experiences difficulty in achieving sexual satisfaction post-menopause, noting a decrease in sexual desire.      Medications: Show/hide medication list[1]       Objective    BP 113/62 (BP Location: Left Arm, Patient Position: Sitting, Cuff Size: Normal)   Pulse 68   Temp 98.1 F (36.7 C) (Oral)   Ht 5' 4 (1.626 m)   Wt 123 lb 12.8 oz (56.2 kg)   SpO2 100%   BMI 21.25 kg/m     Physical Exam Constitutional:      Appearance: Normal appearance.  HENT:      Head: Normocephalic and atraumatic.  Eyes:     General: No scleral icterus.    Extraocular Movements: Extraocular movements intact.     Conjunctiva/sclera: Conjunctivae normal.  Cardiovascular:     Rate and Rhythm: Normal rate and regular rhythm.     Pulses: Normal pulses.     Heart sounds: Normal heart sounds.  Pulmonary:     Effort: Pulmonary effort is normal. No respiratory distress.     Breath sounds: Normal breath sounds.  Musculoskeletal:     Right lower leg: No edema.     Left lower leg: No edema.  Skin:    General: Skin is warm and dry.  Neurological:     Mental Status: She is alert and oriented to person, place, and time. Mental status is at baseline.  Psychiatric:        Mood and Affect: Mood normal.        Behavior: Behavior normal.      No results found for any visits on 11/27/24.  Assessment & Plan    Primary hypertension Assessment & Plan: Chronic, stable on lisinopril  10 mg daily. No changes today.   Post-menopausal atrophic vaginitis -     Estradiol ; Place 1 Applicatorful vaginally 3 (three) times a week.  Dispense: 42.5 g; Refill: 12  H/O cold sores -     valACYclovir  HCl;  Take 1 tablet (500 mg total) by mouth daily.  Dispense: 90 tablet; Refill: 0  Decreased sexual desire  Need for influenza vaccination -     Flu vaccine HIGH DOSE PF(Fluzone Trivalent)  Encounter for colorectal cancer screening -     Ambulatory referral to Gastroenterology    Routine health maintenance Colonoscopy recommended for colon cancer screening. Discussed shingles vaccine and side effects. Clarified second dose timing flexibility. - Referred for colonoscopy. - Administered flu shot. - Discussed shingles vaccine and potential side effects.  Postmenopausal atrophic vaginitis; decreased sexual desire Using estradiol  cream with improvement. Discussed Replens for moisture and testosterone therapy for sexual desire. Addyi discussed for postmenopausal women under 33 Yo;  discussed risks of Addyi for women >65 Yo (specifically concern for low blood pressure and potential syncope, in addition to standard common side effects). - Prescribed estradiol  cream with refills. - Consider Replens for additional moisture. - Discussed potential referral to OB-GYN for therapy with testosterone - Discussed possible treatment with Addyi off-label, with potential adjustment of pressure medication.  H/O cold sores Occasional cold sores treatment with valacyclovir  as needed.  Refilled valacyclovir  today.   Return in about 9 months (around 08/29/2025) for CPE/Chronic f/u w/Dr. Franchot, and within the coming 3 months for mAWV with [facility].      I discussed the assessment and treatment plan with the patient  The patient was provided an opportunity to ask questions and all were answered. The patient agreed with the plan and demonstrated an understanding of the instructions.   The patient was advised to call back or seek an in-person evaluation if the symptoms worsen or if the condition fails to improve as anticipated.    LAURAINE LOISE BUOY, DO  [facility] [phone] (phone) [phone] (fax)  [facility]    [1]  Outpatient Medications Prior to Visit  Medication Sig   ALPRAZolam  (XANAX ) 0.5 MG tablet Take 1 tablet (0.5 mg total) by mouth 3 (three) times daily as needed for anxiety.   lisinopril  (ZESTRIL ) 10 MG tablet Take 1 tablet (10 mg total) by mouth daily.   PARoxetine  (PAXIL ) 20 MG tablet Take 1 tablet (20 mg total) by mouth daily.   [DISCONTINUED] estradiol  (ESTRACE ) 0.1 MG/GM vaginal cream Place 1 Applicatorful vaginally 3 (three) times a week. NEED APPOINTMENT for additional refills.   [DISCONTINUED] valACYclovir  (VALTREX ) 500 MG tablet Take 1 tablet by mouth once daily   No facility-administered medications prior to visit.   "

## 2024-12-07 ENCOUNTER — Telehealth: Payer: Self-pay

## 2024-12-07 ENCOUNTER — Other Ambulatory Visit: Payer: Self-pay

## 2024-12-07 DIAGNOSIS — Z1211 Encounter for screening for malignant neoplasm of colon: Secondary | ICD-10-CM

## 2024-12-07 MED ORDER — NA SULFATE-K SULFATE-MG SULF 17.5-3.13-1.6 GM/177ML PO SOLN: 1.0000 | Freq: Once | ORAL | 0 refills | Status: AC

## 2024-12-07 NOTE — Telephone Encounter (Signed)
 Gastroenterology Pre-Procedure Review  Request Date: 01/15/25 Requesting Physician: Dr. Melany  PATIENT REVIEW QUESTIONS: The patient responded to the following health history questions as indicated:    1. Are you having any GI issues? no 2. Do you have a personal history of Polyps? no 3. Do you have a family history of Colon Cancer or Polyps? no 4. Diabetes Mellitus? no 5. Joint replacements in the past 12 months?no 6. Major health problems in the past 3 months?no 7. Any artificial heart valves, MVP, or defibrillator?No    MEDICATIONS & ALLERGIES:    Patient reports the following regarding taking any anticoagulation/antiplatelet therapy:   Plavix, Coumadin, Eliquis, Xarelto, Lovenox, Pradaxa, Brilinta, or Effient? no Aspirin? no  Patient confirms/reports the following medications:  Current Outpatient Medications  Medication Sig Dispense Refill   ALPRAZolam  (XANAX ) 0.5 MG tablet Take 1 tablet (0.5 mg total) by mouth 3 (three) times daily as needed for anxiety. 90 tablet 0   estradiol  (ESTRACE ) 0.01 % CREA vaginal cream Place 1 Applicatorful vaginally 3 (three) times a week. 42.5 g 12   lisinopril  (ZESTRIL ) 10 MG tablet Take 1 tablet (10 mg total) by mouth daily. 90 tablet 3   PARoxetine  (PAXIL ) 20 MG tablet Take 1 tablet (20 mg total) by mouth daily. 90 tablet 3   valACYclovir  (VALTREX ) 500 MG tablet Take 1 tablet (500 mg total) by mouth daily. 90 tablet 0   No current facility-administered medications for this visit.    Patient confirms/reports the following allergies:  Allergies[1]  No orders of the defined types were placed in this encounter.   AUTHORIZATION INFORMATION Primary Insurance: 1D#: Group #:  Secondary Insurance: 1D#: Group #:  SCHEDULE INFORMATION: Date: 01/15/25 Time: Location: [facility]    [1] No Known Allergies

## 2025-01-07 ENCOUNTER — Encounter: Payer: Self-pay | Admitting: Gastroenterology

## 2025-01-15 ENCOUNTER — Encounter: Payer: Self-pay | Admitting: Anesthesiology

## 2025-01-15 ENCOUNTER — Encounter
Admission: RE | Disposition: A | Discharge status: Home / Self Care | Payer: Self-pay | Source: Home / Self Care | Attending: Gastroenterology

## 2025-01-15 ENCOUNTER — Encounter: Payer: Self-pay | Admitting: Gastroenterology

## 2025-01-15 ENCOUNTER — Other Ambulatory Visit: Payer: Self-pay

## 2025-01-15 ENCOUNTER — Ambulatory Visit: Admission: RE | Admit: 2025-01-15 | Source: Home / Self Care | Admitting: Gastroenterology

## 2025-01-15 DIAGNOSIS — Z1211 Encounter for screening for malignant neoplasm of colon: Secondary | ICD-10-CM

## 2025-01-15 DIAGNOSIS — K640 First degree hemorrhoids: Secondary | ICD-10-CM

## 2025-01-15 MED ORDER — SODIUM CHLORIDE 0.9 % IV SOLN: INTRAVENOUS | Status: DC

## 2025-01-15 MED ORDER — STERILE WATER FOR IRRIGATION IR SOLN
Status: DC | PRN
  Administered 2025-01-15: 1

## 2025-01-15 MED ORDER — LIDOCAINE HCL (CARDIAC) PF 100 MG/5ML IV SOSY
PREFILLED_SYRINGE | INTRAVENOUS | Status: DC | PRN
  Administered 2025-01-15: 40 mg via INTRAVENOUS

## 2025-01-15 MED ORDER — PROPOFOL 10 MG/ML IV BOLUS
INTRAVENOUS | Status: DC | PRN
  Administered 2025-01-15: 80 mg via INTRAVENOUS
  Administered 2025-01-15: 20 mg via INTRAVENOUS
  Administered 2025-01-15: 40 mg via INTRAVENOUS
  Administered 2025-01-15 (×2): 20 mg via INTRAVENOUS

## 2025-01-15 NOTE — H&P (Signed)
 "  Madison Schaffer, MD  [address], [address] [place], [place] [address] Phone: [phone] Fax : [phone]  Primary Care Physician:  [facility] Primary Gastroenterologist:  Dr. Schaffer  Pre-Procedure History & Physical: HPI:  Madison Reese is a 69 y.o. female is here for a screening colonoscopy.  Prior colonoscopy? 2008 [facility] CRC? No Blood thinners? No  Past Medical History:  Diagnosis Date   Anxiety     Past Surgical History:  Procedure Laterality Date   ABDOMINAL HYSTERECTOMY  2008   Partial- due to menorrhagia   FOOT SURGERY Bilateral 02/2005   removal of 5th digit on bilateral feet   TUBAL LIGATION  1995    Prior to Admission medications  Medication Sig Start Date End Date Taking? Authorizing Provider  calcium carbonate (OS-CAL) 1250 (500 Ca) MG chewable tablet Chew 1 tablet by mouth daily.   Yes [provider]  Cholecalciferol (D3 HIGH POTENCY) 25 MCG (1000 UT) capsule Take 1,000 Units by mouth daily.   Yes [provider]  lisinopril  (ZESTRIL ) 10 MG tablet Take 1 tablet (10 mg total) by mouth daily. 08/28/24  Yes Pardue, Lauraine SAILOR, DO  Multiple Vitamins-Minerals (MULTIVITAMIN WITH MINERALS) tablet Take 1 tablet by mouth daily.   Yes [provider]  Omega-3 Fatty Acids (FISH OIL) 1000 MG CAPS Take 1 capsule by mouth daily.   Yes [provider]  PARoxetine  (PAXIL ) 20 MG tablet Take 1 tablet (20 mg total) by mouth daily. 08/28/24  Yes Pardue, Lauraine SAILOR, DO  valACYclovir  (VALTREX ) 500 MG tablet Take 1 tablet (500 mg total) by mouth daily. 11/27/24  Yes Pardue, Lauraine SAILOR, DO  ALPRAZolam  (XANAX ) 0.5 MG tablet Take 1 tablet (0.5 mg total) by mouth 3 (three) times daily as needed for anxiety. 08/28/24   Donzella Lauraine SAILOR, DO  estradiol  (ESTRACE ) 0.01 % CREA vaginal cream Place 1 Applicatorful vaginally 3 (three) times a week. 11/27/24   Donzella Lauraine SAILOR, DO    Allergies as of 12/07/2024   (No Known Allergies)    Family  History  Problem Relation Age of Onset   Arthritis Mother    Depression Mother    Hypertension Mother    Cancer Mother 59       Pancreatic cancer   Heart attack Father    Hyperlipidemia Sister    Hypertension Brother    Lung cancer Maternal Uncle    Diabetes Maternal Uncle    Alzheimer's disease Maternal Grandmother    Depression Sister    Breast cancer Neg Hx     Social History   Socioeconomic History   Marital status: Married    Spouse name: Not on file   Number of children: Not on file   Years of education: Not on file   Highest education level: 12th grade  Occupational History   Not on file  Tobacco Use   Smoking status: Never   Smokeless tobacco: Never  Vaping Use   Vaping status: Never Used  Substance and Sexual Activity   Alcohol use: No   Drug use: No   Sexual activity: Yes  Other Topics Concern   Not on file  Social History Narrative   Not on file   Social Drivers of Health   Tobacco Use: Low Risk (01/15/2025)   Patient History    Smoking Tobacco Use: Never    Smokeless Tobacco Use: Never    Passive Exposure: Not on file  Financial Resource Strain: Low Risk (11/26/2024)   Overall  Financial Resource Strain (CARDIA)    Difficulty of Paying Living Expenses: Not hard at all  Food Insecurity: No Food Insecurity (11/26/2024)   Epic    Worried About Programme Researcher, Broadcasting/film/video in the Last Year: Never true    Ran Out of Food in the Last Year: Never true  Transportation Needs: No Transportation Needs (11/26/2024)   Epic    Lack of Transportation (Medical): No    Lack of Transportation (Non-Medical): No  Physical Activity: Insufficiently Active (11/26/2024)   Exercise Vital Sign    Days of Exercise per Week: 3 days    Minutes of Exercise per Session: 30 min  Stress: No Stress Concern Present (11/26/2024)   [employer] of Occupational Health - Occupational Stress Questionnaire    Feeling of Stress: Not at all  Social Connections: Moderately Integrated  (11/26/2024)   Social Connection and Isolation Panel    Frequency of Communication with Friends and Family: More than three times a week    Frequency of Social Gatherings with Friends and Family: Once a week    Attends Religious Services: More than 4 times per year    Active Member of [employer] or Organizations: No    Attends Banker Meetings: Not on file    Marital Status: Married  Intimate Partner Violence: Not At Risk (08/28/2024)   Epic    Fear of Current or Ex-Partner: No    Emotionally Abused: No    Physically Abused: No    Sexually Abused: No  Depression (PHQ2-9): Low Risk (11/27/2024)   Depression (PHQ2-9)    PHQ-2 Score: 0  Alcohol Screen: Low Risk (11/28/2022)   Alcohol Screen    Last Alcohol Screening Score (AUDIT): 0  Housing: Low Risk (11/26/2024)   Epic    Unable to Pay for Housing in the Last Year: No    Number of Times Moved in the Last Year: 0    Homeless in the Last Year: No  Utilities: Not At Risk (08/28/2024)   Epic    Threatened with loss of utilities: No  Health Literacy: Adequate Health Literacy (08/28/2024)   B1300 Health Literacy    Frequency of need for help with medical instructions: Never    Review of Systems: See HPI, otherwise negative ROS  Physical Exam: BP 124/82   Pulse 62   Temp 98.2 F (36.8 C) (Temporal)   Resp 14   Ht 5' 4.02 (1.626 m)   Wt 54.4 kg   SpO2 100%   BMI 20.57 kg/m  CONSTITUTIONAL: Well-appearing in no acute distress.  HEENT: Pupils equal, round, Extraocular movements intact. Conjunctivae clear NECK: Neck supple CARDIOVASCULAR: Regular rate, no LE edema  RESPIRATORY: No labored breathing  ABDOMEN: Abdomen soft, nontender, not distended, no guarding, no rigidity SKIN: No apparent skin rashes or lesions. NEUROLOGIC: Normal speech, no focal findings. Mental status alert and oriented x4. PSYCHIATRIC: Mood and affect normal.   Impression/Plan: Madison Reese is now here to undergo a screening  colonoscopy.  Risks, benefits, and alternatives regarding colonoscopy have been reviewed with the patient.  Questions have been answered.  All parties agreeable.  "

## 2025-01-15 NOTE — Transfer of Care (Signed)
 Immediate Anesthesia Transfer of Care Note  Patient: Madison Reese  Procedure(s) Performed: COLONOSCOPY  Patient Location: PACU  Anesthesia Type: General, MAC  Level of Consciousness: awake, alert  and patient cooperative  Airway and Oxygen Therapy: Patient Spontanous Breathing and Patient connected to supplemental oxygen  Post-op Assessment: Post-op Vital signs reviewed, Patient's Cardiovascular Status Stable, Respiratory Function Stable, Patent Airway and No signs of Nausea or vomiting  Post-op Vital Signs: Reviewed and stable  Complications: No notable events documented.

## 2025-01-15 NOTE — Op Note (Signed)
 [facility] Gastroenterology Patient Name: Madison Reese Procedure Date: 01/15/2025 11:24 AM MRN: [id number] Account #: [id number] Date of Birth: [birth year] Admit Type: Outpatient Age: 69 Room: [facility] OR ROOM 01 Gender: Female Note Status: Finalized Instrument Name: Peds Colonoscope [id number] Procedure:             Colonoscopy Indications:           Screening for colorectal malignant neoplasm Providers:             [name], MD Referring MD:          [facility] (Referring MD) Medicines:             Propofol  per Anesthesia Complications:         No immediate complications. Procedure:             Pre-Anesthesia Assessment:                        - Prior to the procedure, a History and Physical was                         performed, and patient medications and allergies were                         reviewed. The patient's tolerance of previous                         anesthesia was also reviewed. The risks and benefits                         of the procedure and the sedation options and risks                         were discussed with the patient. All questions were                         answered, and informed consent was obtained. Prior                         Anticoagulants: The patient has taken no anticoagulant                         or antiplatelet agents. ASA Grade Assessment: II - A                         patient with mild systemic disease. After reviewing                         the risks and benefits, the patient was deemed in                         satisfactory condition to undergo the procedure.                        After obtaining informed consent, the colonoscope was                         passed under direct vision. Throughout the procedure,  the patient's blood pressure, pulse, and oxygen                         saturations were monitored continuously. The                         Colonoscope was  introduced through the anus and                         advanced to the the cecum, identified by appendiceal                         orifice and ileocecal valve. The colonoscopy was                         performed without difficulty. The patient tolerated                         the procedure well. The quality of the bowel                         preparation was good. The ileocecal valve, appendiceal                         orifice, and rectum were photographed. Findings:      Internal hemorrhoids were found during retroflexion. The hemorrhoids       were Grade I (internal hemorrhoids that do not prolapse). Impression:            - Internal hemorrhoids.                        - No specimens collected. Recommendation:        - Patient has a contact number available for                         emergencies. The signs and symptoms of potential                         delayed complications were discussed with the patient.                         Return to normal activities tomorrow. Written                         discharge instructions were provided to the patient.                        - High fiber diet indefinitely.                        - Continue present medications.                        - Repeat colonoscopy in 10 years for screening                         purposes.                        - The findings  and recommendations were discussed with                         the designated responsible adult. Procedure Code(s):     --- Professional ---                        H9878, Colorectal cancer screening; colonoscopy on                         individual not meeting criteria for high risk Diagnosis Code(s):     --- Professional ---                        Z12.11, Encounter for screening for malignant neoplasm                         of colon CPT copyright 2022 [employer]. All rights reserved. The codes documented in this report are preliminary and upon coder review  may  be revised to meet current compliance requirements. [name], MD 01/15/2025 12:01:43 PM Number of Addenda: 0 Note Initiated On: 01/15/2025 11:24 AM Scope Withdrawal Time: 0 hours 6 minutes 20 seconds  Total Procedure Duration: 0 hours 12 minutes 58 seconds  Estimated Blood Loss:  Estimated blood loss: none.      [facility]

## 2025-01-15 NOTE — Anesthesia Postprocedure Evaluation (Signed)
"   Anesthesia Post Note  Patient: Madison Reese  Procedure(s) Performed: COLONOSCOPY  Patient location during evaluation: PACU Anesthesia Type: MAC and General Level of consciousness: awake and alert Pain management: pain level controlled Vital Signs Assessment: post-procedure vital signs reviewed and stable Respiratory status: spontaneous breathing, nonlabored ventilation, respiratory function stable and patient connected to nasal cannula oxygen Cardiovascular status: stable and blood pressure returned to baseline Postop Assessment: no apparent nausea or vomiting Anesthetic complications: no   No notable events documented.   Last Vitals:  Vitals:   01/15/25 1204 01/15/25 1211  BP: (!) 100/46 116/72  Pulse: (!) 55 69  Resp: 14 14  Temp: (!) 36.3 C (!) 36.3 C  SpO2: 97% 99%    Last Pain:  Vitals:   01/15/25 1211  TempSrc:   PainSc: 0-No pain                 Redell MARLA Breaker      "

## 2025-01-15 NOTE — Anesthesia Preprocedure Evaluation (Signed)
"                                    Anesthesia Evaluation  Patient identified by MRN, date of birth, ID band Patient awake    Reviewed: Allergy & Precautions, H&P , NPO status , Patient's Chart, lab work & pertinent test results, reviewed documented beta blocker date and time   Airway Mallampati: II  TM Distance: >3 FB Neck ROM: Full    Dental no notable dental hx.    Pulmonary neg pulmonary ROS   Pulmonary exam normal breath sounds clear to auscultation       Cardiovascular Exercise Tolerance: Good negative cardio ROS Normal cardiovascular exam Rhythm:Regular Rate:Normal     Neuro/Psych negative neurological ROS  negative psych ROS   GI/Hepatic negative GI ROS, Neg liver ROS,,,  Endo/Other  negative endocrine ROS    Renal/GU negative Renal ROS  negative genitourinary   Musculoskeletal negative musculoskeletal ROS (+)    Abdominal   Peds negative pediatric ROS (+)  Hematology negative hematology ROS (+)   Anesthesia Other Findings   Reproductive/Obstetrics negative OB ROS                              Anesthesia Physical Anesthesia Plan  ASA: 1  Anesthesia Plan: General and MAC   Post-op Pain Management: Minimal or no pain anticipated   Induction: Intravenous  PONV Risk Score and Plan:   Airway Management Planned:   Additional Equipment:   Intra-op Plan:   Post-operative Plan: Extubation in OR  Informed Consent: I have reviewed the patients History and Physical, chart, labs and discussed the procedure including the risks, benefits and alternatives for the proposed anesthesia with the patient or authorized representative who has indicated his/her understanding and acceptance.     Dental advisory given  Plan Discussed with: CRNA  Anesthesia Plan Comments:         Anesthesia Quick Evaluation  "

## 2025-06-09 ENCOUNTER — Ambulatory Visit

## 2025-09-01 ENCOUNTER — Encounter

## 2025-09-09 ENCOUNTER — Ambulatory Visit
